# Patient Record
Sex: Female | Born: 1955
Health system: Southern US, Community
[De-identification: ages and names within clinical notes are randomized; demographics above are authoritative.]

## PROBLEM LIST (undated history)

## (undated) DIAGNOSIS — R7301 Impaired fasting glucose: Secondary | ICD-10-CM

## (undated) DIAGNOSIS — Z8041 Family history of malignant neoplasm of ovary: Secondary | ICD-10-CM

## (undated) DIAGNOSIS — N393 Stress incontinence (female) (male): Secondary | ICD-10-CM

## (undated) DIAGNOSIS — N809 Endometriosis, unspecified: Secondary | ICD-10-CM

## (undated) DIAGNOSIS — E559 Vitamin D deficiency, unspecified: Secondary | ICD-10-CM

## (undated) DIAGNOSIS — Z8042 Family history of malignant neoplasm of prostate: Secondary | ICD-10-CM

## (undated) DIAGNOSIS — E039 Hypothyroidism, unspecified: Secondary | ICD-10-CM

## (undated) HISTORY — DX: Family history of malignant neoplasm of prostate: Z80.42

## (undated) HISTORY — PX: TONSILLECTOMY AND ADENOIDECTOMY: SUR1326

## (undated) HISTORY — PX: LAPAROSCOPIC HYSTERECTOMY: SHX1926

## (undated) HISTORY — DX: Impaired fasting glucose: R73.01

## (undated) HISTORY — DX: Endometriosis, unspecified: N80.9

## (undated) HISTORY — DX: Vitamin D deficiency, unspecified: E55.9

## (undated) HISTORY — DX: Stress incontinence (female) (male): N39.3

## (undated) HISTORY — DX: Family history of malignant neoplasm of ovary: Z80.41

## (undated) HISTORY — DX: Hypothyroidism, unspecified: E03.9

---

## 1998-05-09 ENCOUNTER — Ambulatory Visit (HOSPITAL_COMMUNITY): Admission: RE | Admit: 1998-05-09 | Discharge: 1998-05-09 | Payer: Self-pay | Admitting: Family Medicine

## 2001-06-14 ENCOUNTER — Encounter: Payer: Self-pay | Admitting: Family Medicine

## 2001-06-14 ENCOUNTER — Encounter: Admission: RE | Admit: 2001-06-14 | Discharge: 2001-06-14 | Payer: Self-pay | Admitting: Family Medicine

## 2003-07-17 ENCOUNTER — Encounter: Admission: RE | Admit: 2003-07-17 | Discharge: 2003-07-17 | Payer: Self-pay | Admitting: Family Medicine

## 2003-07-17 ENCOUNTER — Encounter: Payer: Self-pay | Admitting: Family Medicine

## 2004-08-28 ENCOUNTER — Encounter: Admission: RE | Admit: 2004-08-28 | Discharge: 2004-08-28 | Payer: Self-pay | Admitting: Family Medicine

## 2004-09-30 ENCOUNTER — Encounter: Admission: RE | Admit: 2004-09-30 | Discharge: 2004-09-30 | Payer: Self-pay | Admitting: Family Medicine

## 2008-05-15 IMAGING — US US-BREAST([ID])
1 series · 14 of 14 positions shown · non-contrast
Comparison: NONE

CLINICAL DATA: c:  Ceejay, Paulus N   Follow-up mammogram. 

LEFT BREAST ULTRASOUND

[Series 1: us left breast · 0.09mm/px · 14 of 14 slices shown]
[im 1/14]
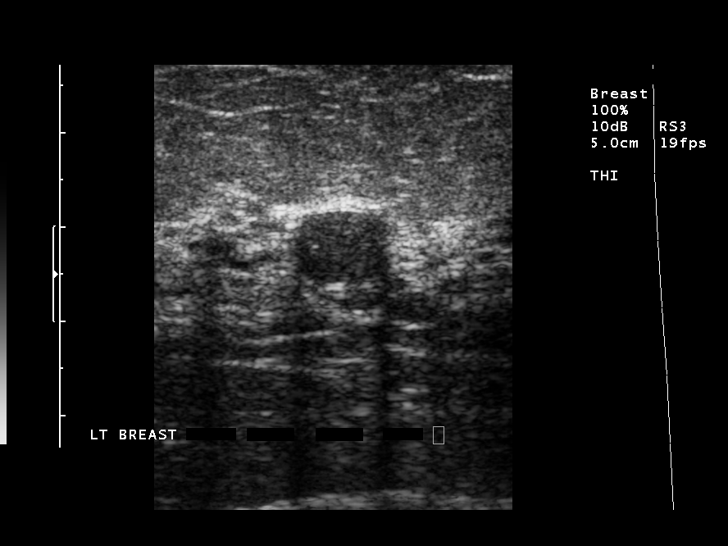
[im 2/14]
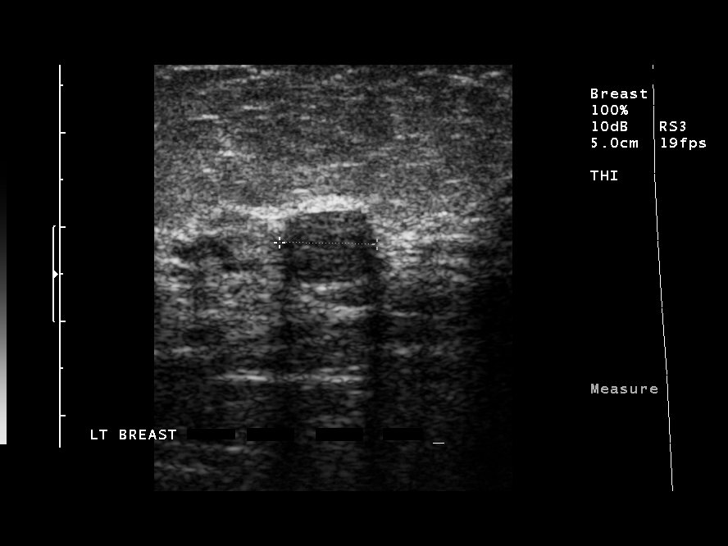
[im 3/14]
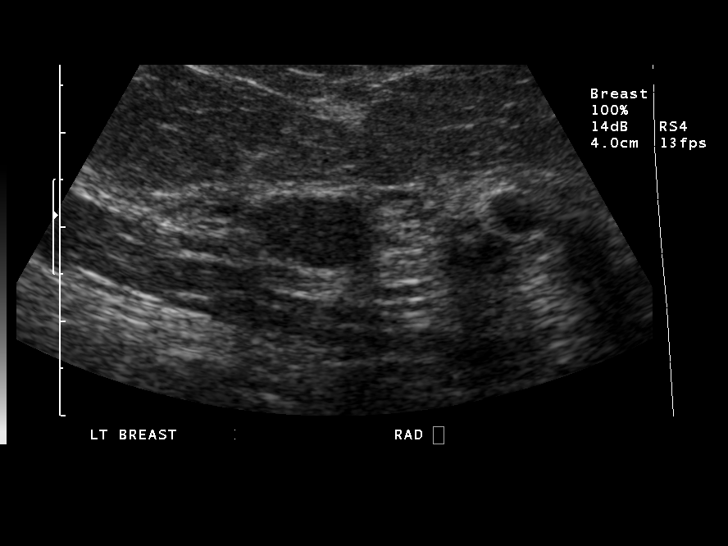
[im 4/14]
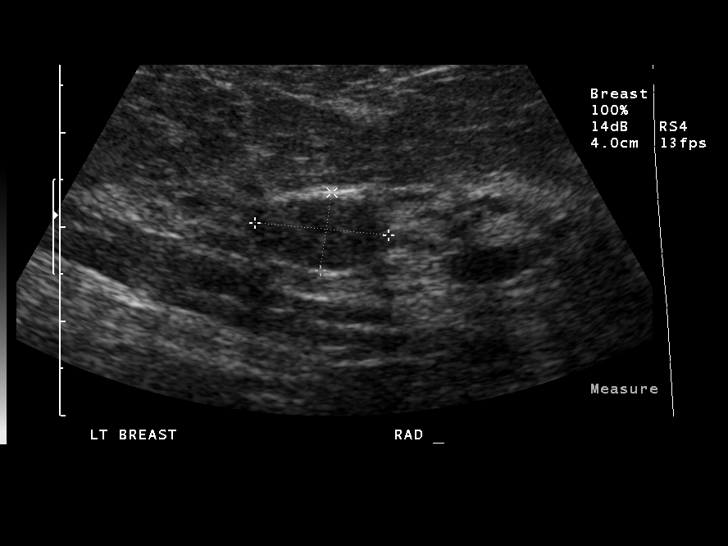
[im 5/14]
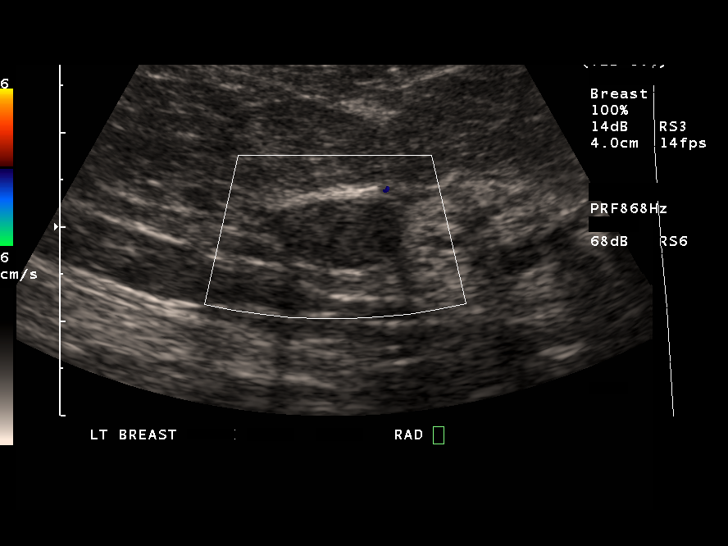
[im 6/14]
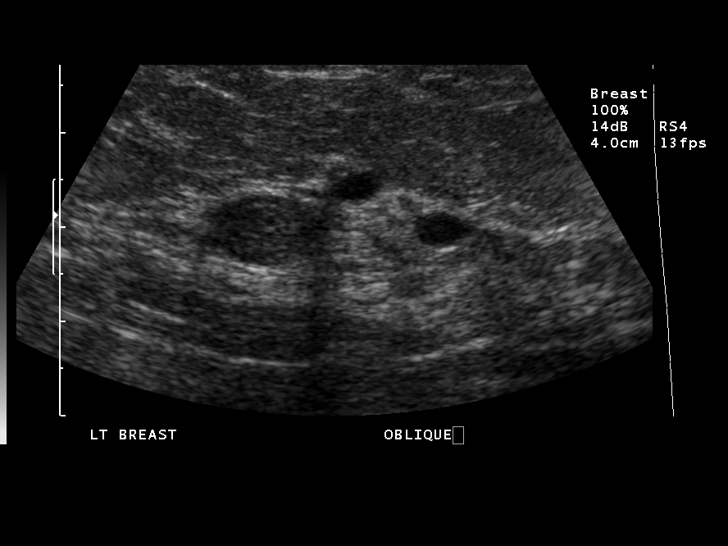
[im 7/14]
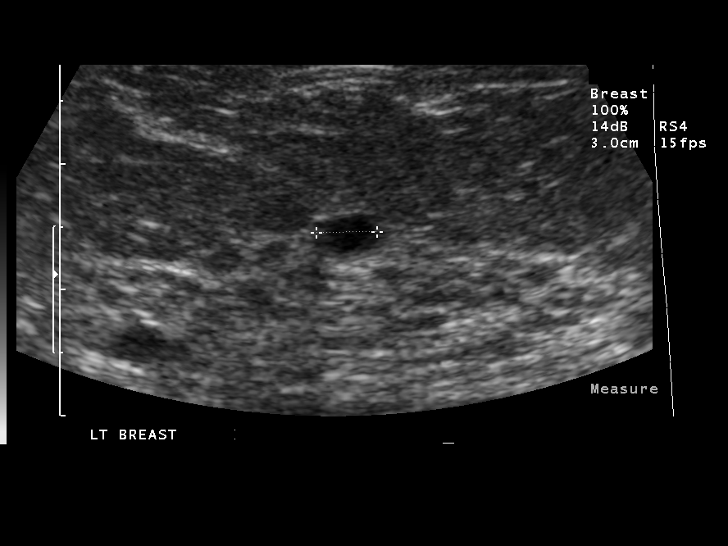
[im 8/14]
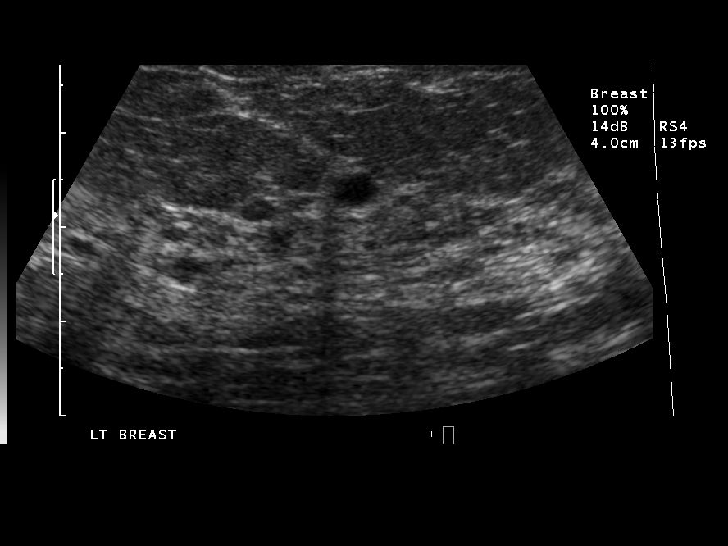
[im 9/14]
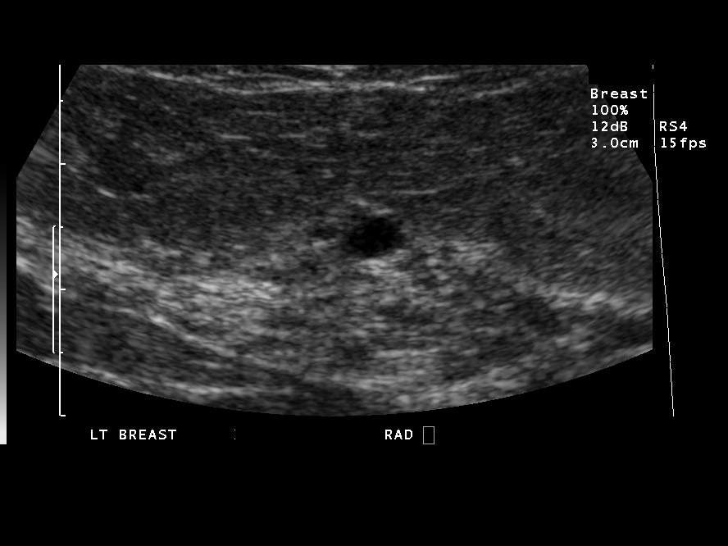
[im 10/14]
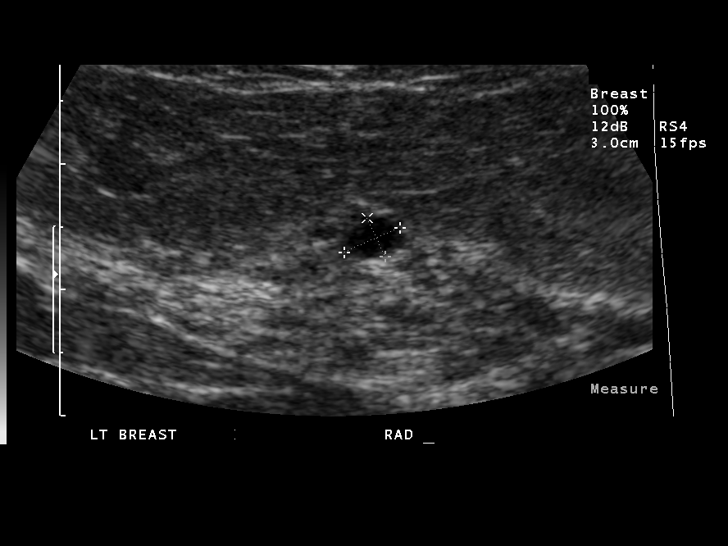
[im 11/14]
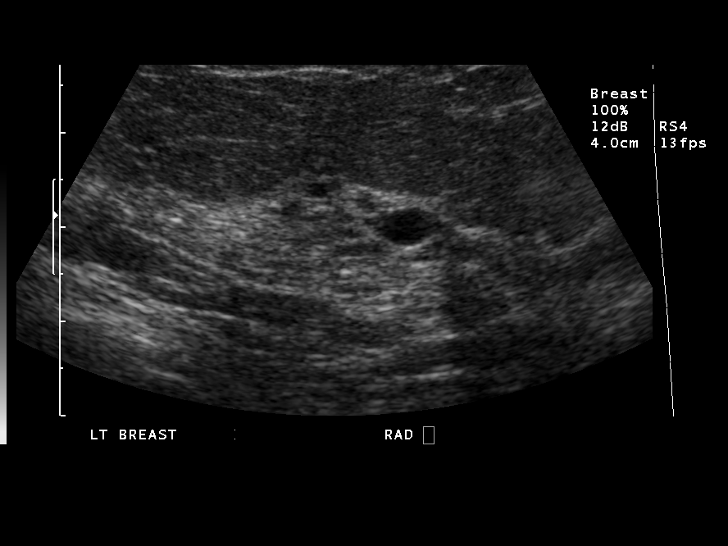
[im 12/14]
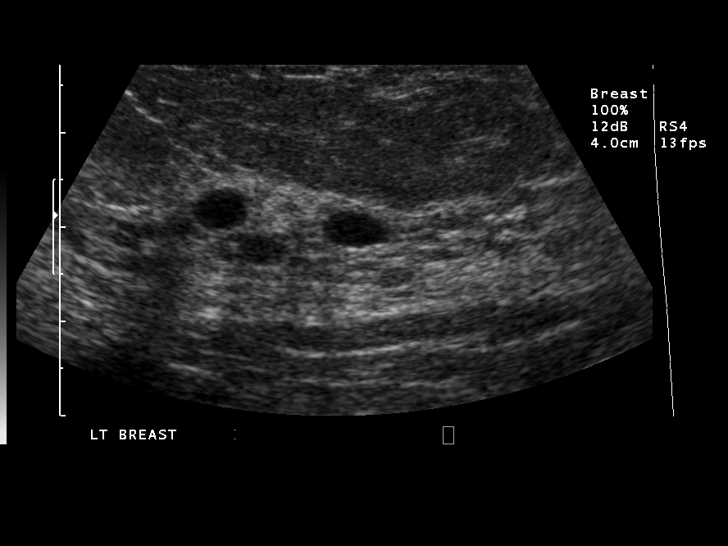
[im 13/14]
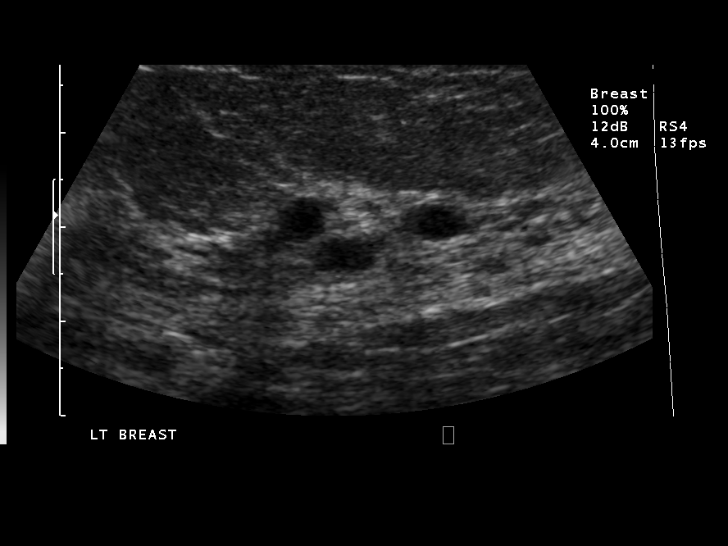
[im 14/14]
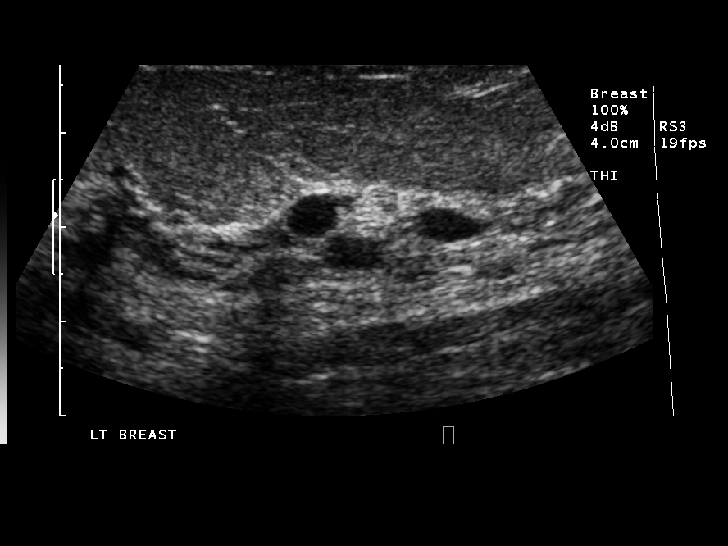

[14 of 14 positions shown; findings below may reference images not displayed]

FINDINGS: Ultrasound of the left breast demonstrates multiple 
cysts as well as areas of fibrocystic type change.  There is a 
debris filled cyst in the 12 o???clock position likely corresponding 
to the mammographic finding.
IMPRESSION: Ultrasound demonstrates no finding suspicious for 
malignancy.  Continue routine screening mammogram in one year. 
BI-RADS 2- Benign Tiger, Macho electronically reviewed 
on 10/03/2007 Dict Date: 09/30/2007  Tran Date:  10/03/2007 LJ  
GAUSE

## 2010-11-09 ENCOUNTER — Encounter: Payer: Self-pay | Admitting: Internal Medicine

## 2016-06-16 DIAGNOSIS — E039 Hypothyroidism, unspecified: Secondary | ICD-10-CM | POA: Diagnosis not present

## 2016-06-16 DIAGNOSIS — E038 Other specified hypothyroidism: Secondary | ICD-10-CM | POA: Diagnosis not present

## 2016-09-25 DIAGNOSIS — Z23 Encounter for immunization: Secondary | ICD-10-CM | POA: Diagnosis not present

## 2016-12-18 ENCOUNTER — Encounter: Payer: Self-pay | Admitting: Family Medicine

## 2016-12-18 ENCOUNTER — Ambulatory Visit (INDEPENDENT_AMBULATORY_CARE_PROVIDER_SITE_OTHER): Payer: BLUE CROSS/BLUE SHIELD | Admitting: Family Medicine

## 2016-12-18 VITALS — BP 140/92 | HR 88 | Ht 67.0 in | Wt 198.6 lb

## 2016-12-18 DIAGNOSIS — E039 Hypothyroidism, unspecified: Secondary | ICD-10-CM | POA: Diagnosis not present

## 2016-12-18 DIAGNOSIS — R5383 Other fatigue: Secondary | ICD-10-CM

## 2016-12-18 DIAGNOSIS — Z7689 Persons encountering health services in other specified circumstances: Secondary | ICD-10-CM | POA: Diagnosis not present

## 2016-12-18 DIAGNOSIS — Z8639 Personal history of other endocrine, nutritional and metabolic disease: Secondary | ICD-10-CM | POA: Diagnosis not present

## 2016-12-18 DIAGNOSIS — R030 Elevated blood-pressure reading, without diagnosis of hypertension: Secondary | ICD-10-CM | POA: Diagnosis not present

## 2016-12-18 LAB — CBC WITH DIFFERENTIAL/PLATELET
BASOS PCT: 1 %
Basophils Absolute: 82 cells/uL (ref 0–200)
EOS PCT: 0 %
Eosinophils Absolute: 0 cells/uL — ABNORMAL LOW (ref 15–500)
HCT: 44 % (ref 35.0–45.0)
Hemoglobin: 14.7 g/dL (ref 11.7–15.5)
Lymphocytes Relative: 28 %
Lymphs Abs: 2296 cells/uL (ref 850–3900)
MCH: 29.5 pg (ref 27.0–33.0)
MCHC: 33.4 g/dL (ref 32.0–36.0)
MCV: 88.4 fL (ref 80.0–100.0)
MONOS PCT: 6 %
MPV: 9.7 fL (ref 7.5–12.5)
Monocytes Absolute: 492 cells/uL (ref 200–950)
NEUTROS ABS: 5330 {cells}/uL (ref 1500–7800)
Neutrophils Relative %: 65 %
PLATELETS: 285 10*3/uL (ref 140–400)
RBC: 4.98 MIL/uL (ref 3.80–5.10)
RDW: 14.4 % (ref 11.0–15.0)
WBC: 8.2 10*3/uL (ref 4.0–10.5)

## 2016-12-18 LAB — COMPREHENSIVE METABOLIC PANEL
ALK PHOS: 70 U/L (ref 33–130)
ALT: 18 U/L (ref 6–29)
AST: 17 U/L (ref 10–35)
Albumin: 3.7 g/dL (ref 3.6–5.1)
BUN: 10 mg/dL (ref 7–25)
CO2: 29 mmol/L (ref 20–31)
CREATININE: 0.72 mg/dL (ref 0.50–0.99)
Calcium: 8.9 mg/dL (ref 8.6–10.4)
Chloride: 104 mmol/L (ref 98–110)
GLUCOSE: 97 mg/dL (ref 65–99)
Potassium: 4.1 mmol/L (ref 3.5–5.3)
SODIUM: 141 mmol/L (ref 135–146)
TOTAL PROTEIN: 7 g/dL (ref 6.1–8.1)
Total Bilirubin: 0.7 mg/dL (ref 0.2–1.2)

## 2016-12-18 LAB — TSH: TSH: 2.4 m[IU]/L

## 2016-12-18 NOTE — Patient Instructions (Signed)
Your blood pressure is elevated today 140/92. Check your blood pressure at home. If you are not seeing readings <130/80 then call me.   Hypertension Hypertension is another name for high blood pressure. High blood pressure forces your heart to work harder to pump blood. This can cause problems over time. There are two numbers in a blood pressure reading. There is a top number (systolic) over a bottom number (diastolic). It is best to have a blood pressure below 120/80. Healthy choices can help lower your blood pressure. You may need medicine to help lower your blood pressure if:  Your blood pressure cannot be lowered with healthy choices.  Your blood pressure is higher than 130/80. Follow these instructions at home: Eating and drinking   If directed, follow the DASH eating plan. This diet includes:  Filling half of your plate at each meal with fruits and vegetables.  Filling one quarter of your plate at each meal with whole grains. Whole grains include whole wheat pasta, brown rice, and whole grain bread.  Eating or drinking low-fat dairy products, such as skim milk or low-fat yogurt.  Filling one quarter of your plate at each meal with low-fat (lean) proteins. Low-fat proteins include fish, skinless chicken, eggs, beans, and tofu.  Avoiding fatty meat, cured and processed meat, or chicken with skin.  Avoiding premade or processed food.  Eat less than 1,500 mg of salt (sodium) a day.  Limit alcohol use to no more than 1 drink a day for nonpregnant women and 2 drinks a day for men. One drink equals 12 oz of beer, 5 oz of wine, or 1 oz of hard liquor. Lifestyle   Work with your doctor to stay at a healthy weight or to lose weight. Ask your doctor what the best weight is for you.  Get at least 30 minutes of exercise that causes your heart to beat faster (aerobic exercise) most days of the week. This may include walking, swimming, or biking.  Get at least 30 minutes of exercise that  strengthens your muscles (resistance exercise) at least 3 days a week. This may include lifting weights or pilates.  Do not use any products that contain nicotine or tobacco. This includes cigarettes and e-cigarettes. If you need help quitting, ask your doctor.  Check your blood pressure at home as told by your doctor.  Keep all follow-up visits as told by your doctor. This is important. Medicines   Take over-the-counter and prescription medicines only as told by your doctor. Follow directions carefully.  Do not skip doses of blood pressure medicine. The medicine does not work as well if you skip doses. Skipping doses also puts you at risk for problems.  Ask your doctor about side effects or reactions to medicines that you should watch for. Contact a doctor if:  You think you are having a reaction to the medicine you are taking.  You have headaches that keep coming back (recurring).  You feel dizzy.  You have swelling in your ankles.  You have trouble with your vision. Get help right away if:  You get a very bad headache.  You start to feel confused.  You feel weak or numb.  You feel faint.  You get very bad pain in your:  Chest.  Belly (abdomen).  You throw up (vomit) more than once.  You have trouble breathing. Summary  Hypertension is another name for high blood pressure.  Making healthy choices can help lower blood pressure. If your blood pressure  cannot be controlled with healthy choices, you may need to take medicine. This information is not intended to replace advice given to you by your health care provider. Make sure you discuss any questions you have with your health care provider. Document Released: 03/23/2008 Document Revised: 09/02/2016 Document Reviewed: 09/02/2016 Elsevier Interactive Patient Education  2017 [employer]

## 2016-12-18 NOTE — Progress Notes (Signed)
Subjective:    Patient ID: Krystal Padilla, female    DOB: Mar 26, 1956, 61 y.o.   MRN: [id number]  HPI Chief Complaint  Patient presents with  . new pt    new pt get established, 8 months ago, fatigue, and hair falling out, had low T4 in the past and hasnt had it checked in years, and vitamin d might be low as well.    She is new to the practice and here to establish care.  Previous medical care: Tomi Bamberger in [place]. States the office closed and she cannot get any of her medical records.  Last CPE: years ago  Complains of hair loss and fatigue for the past 8-9 months. Also reports "ridges on fingernails". Feeling cold a lot more often. States she feels like bilateral grip strength weaker in general. Palpitations for several months. Occasional constipation with a long history of constipation. Denies blood in stool.   Denies fever, chills, unexplained weight change, chest pain, abdominal pain, N/V/D.   States she is not doing well with lifestyle management. States she does not have time or energy to exercise due to work and taking care of her father.   Other providers: none  Past medical history: hypothyroidism, elevated BP in the past but has not taken medication for this. Hyperlipidemia- states she has taken medication for this in the past and it made her muscles ache and speech slurred. Has tried 2 cholesterol medications.  ?IBD, diverticula, statin intolerance   Family history: mother with ovarian cancer   Social history: Lives alone, divorced, works as an Print production planner. 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  Denies smoking, drinking alcohol, drug use  Diet: nothing particular Excerise: nothing   Health maintenance:  Mammogram: last one 4-5 years ago. States she does not plan on having another one.  Colonoscopy: age 27. States she will "never have another one because of the prep". States it almost "killed me".  Last Gynecological Exam: years ago Last Menstrual cycle:  hysterectomy    Reviewed allergies, medications, past medical, surgical, family, and social history.   Review of Systems Pertinent positives and negatives in the history of present illness.     Objective:   Physical Exam  Constitutional: She is oriented to person, place, and time. She appears well-developed and well-nourished. No distress.  HENT:  Nose: Nose normal.  Mouth/Throat: Uvula is midline, oropharynx is clear and moist and mucous membranes are normal.  No alopecia   Eyes: Conjunctivae and lids are normal. Pupils are equal, round, and reactive to light.  Neck: Full passive range of motion without pain. Neck supple. No thyromegaly present.  Cardiovascular: Normal rate, regular rhythm, normal heart sounds and normal pulses.   Pulmonary/Chest: Effort normal and breath sounds normal.  Lymphadenopathy:    She has no cervical adenopathy.  Neurological: She is alert and oriented to person, place, and time. She has normal strength and normal reflexes. No cranial nerve deficit or sensory deficit. Coordination and gait normal.  Skin: Skin is warm and dry. No rash noted. No pallor. Nails show no clubbing.  Normal hair pattern Nails smooth, no obvious ridges   Psychiatric: She has a normal mood and affect. Her speech is normal and behavior is normal. Judgment and thought content normal. Cognition and memory are normal.   BP (!) 140/92   Pulse 88   Ht 5\' 7"  (1.702 m)   Wt 198 lb 9.6 oz (90.1 kg)   BMI 31.11 kg/m  Assessment & Plan:  Fatigue, unspecified type - Plan: CBC with Differential/Platelet, Comprehensive metabolic panel, TSH, VITAMIN D 25 Hydroxy (Vit-D Deficiency, Fractures)  Hypothyroidism, unspecified type - Plan: TSH  History of vitamin D deficiency - Plan: VITAMIN D 25 Hydroxy (Vit-D Deficiency, Fractures)  Encounter to establish care  Elevated blood pressure reading  Discussed that she has multiple complaints today and there is no obvious explanation for  her symptoms. Reassured her that her exam is unremarkable but we will check labs and look for any underlying etiology.  She is overdue for health maintenance such as mammogram and colonoscopy but is adamant that she would not do anything if she found out she had cancer or anything serious so she is not going to have anymore of these screening tests.  Discussed that this is her right but part of my role as her PCP is to make her aware of preventive health care. Discussed shared medical decision making and what she wants out of our patient-provider relationship. States she would like to feel better and have more energy. Discussed that there are numerous etiologies for fatigue and we will check labs. She states she "only wants necessary labs done".  Recommend that she start checking her blood pressure and keep up with these readings. Follow up pending labs.

## 2016-12-19 LAB — VITAMIN D 25 HYDROXY (VIT D DEFICIENCY, FRACTURES): Vit D, 25-Hydroxy: 31 ng/mL (ref 30–100)

## 2016-12-21 ENCOUNTER — Encounter: Payer: Self-pay | Admitting: Internal Medicine

## 2017-01-21 ENCOUNTER — Telehealth: Payer: Self-pay | Admitting: Family Medicine

## 2017-01-21 MED ORDER — SYNTHROID 50 MCG PO TABS: 50.0000 ug | ORAL_TABLET | Freq: Every day | ORAL | 5 refills | Status: DC

## 2017-01-21 NOTE — Telephone Encounter (Signed)
Refilled med to pharmacy 

## 2017-01-21 NOTE — Telephone Encounter (Signed)
Ok to refill. Her TSH was normal last month.

## 2017-01-21 NOTE — Telephone Encounter (Signed)
Pt called for refills of synthroid. We have not filled for her before but is listed in meds. Pt would like the Generic sent into [facility] [address]. Pt can be reached at [phone].

## 2017-06-22 ENCOUNTER — Other Ambulatory Visit: Payer: Self-pay | Admitting: Family Medicine

## 2017-09-07 ENCOUNTER — Encounter: Payer: Self-pay | Admitting: Family Medicine

## 2017-09-07 ENCOUNTER — Ambulatory Visit: Payer: BLUE CROSS/BLUE SHIELD | Admitting: Family Medicine

## 2017-09-07 VITALS — BP 132/100 | HR 80 | Ht 66.75 in | Wt 196.4 lb

## 2017-09-07 DIAGNOSIS — I10 Essential (primary) hypertension: Secondary | ICD-10-CM | POA: Diagnosis not present

## 2017-09-07 DIAGNOSIS — Z5320 Procedure and treatment not carried out because of patient's decision for unspecified reasons: Secondary | ICD-10-CM

## 2017-09-07 DIAGNOSIS — Z79899 Other long term (current) drug therapy: Secondary | ICD-10-CM

## 2017-09-07 DIAGNOSIS — E559 Vitamin D deficiency, unspecified: Secondary | ICD-10-CM | POA: Diagnosis not present

## 2017-09-07 DIAGNOSIS — Z23 Encounter for immunization: Secondary | ICD-10-CM

## 2017-09-07 DIAGNOSIS — N393 Stress incontinence (female) (male): Secondary | ICD-10-CM

## 2017-09-07 DIAGNOSIS — Z1159 Encounter for screening for other viral diseases: Secondary | ICD-10-CM | POA: Diagnosis not present

## 2017-09-07 DIAGNOSIS — Z114 Encounter for screening for human immunodeficiency virus [HIV]: Secondary | ICD-10-CM

## 2017-09-07 DIAGNOSIS — Z0000 Encounter for general adult medical examination without abnormal findings: Secondary | ICD-10-CM

## 2017-09-07 DIAGNOSIS — R7301 Impaired fasting glucose: Secondary | ICD-10-CM | POA: Diagnosis not present

## 2017-09-07 DIAGNOSIS — E039 Hypothyroidism, unspecified: Secondary | ICD-10-CM

## 2017-09-07 LAB — POCT URINALYSIS DIP (PROADVANTAGE DEVICE)
BILIRUBIN UA: NEGATIVE
BILIRUBIN UA: NEGATIVE mg/dL
Blood, UA: NEGATIVE
GLUCOSE UA: NEGATIVE mg/dL
LEUKOCYTES UA: NEGATIVE
NITRITE UA: NEGATIVE
Protein Ur, POC: NEGATIVE mg/dL
Specific Gravity, Urine: 1.01
Urobilinogen, Ur: 3.5
pH, UA: 6 (ref 5.0–8.0)

## 2017-09-07 MED ORDER — LISINOPRIL 10 MG PO TABS: 10.0000 mg | ORAL_TABLET | Freq: Every day | ORAL | 1 refills | Status: DC

## 2017-09-07 NOTE — Patient Instructions (Addendum)
Your BP is elevated and it was in March 2018 as well.  You appear to have hypertension and it is untreated. Overtime this can cause heart damage, kidney damage, etc.   Start taking the Lisinopril as prescribed.  I recommend that you start checking her blood pressure outside of here.  Goal BP is <130/80  RETURN IN 4 WEEKS AND BRING IN YOUR BP CUFF AND READINGS.   Diet low in sodium and exercise can also help with your BP.   We will check your thyroid function and refill your thyroid medication as appropriate.  If you decide to go to physical therapy for pelvic floor weakness, you can let us know or you can check into this on your own.  You received a Tdap vaccine today in your arm at the injection site may be sore, red, warm and tender for 1-2 days.  If at any time you decide to get a mammogram or colonoscopy you can call and let me know.    Preventative Care for Adults - Female      MAINTAIN REGULAR HEALTH EXAMS:  A routine yearly physical is a good way to check in with your primary care provider about your health and preventive screening. It is also an opportunity to share updates about your health and any concerns you have, and receive a thorough all-over exam.   Most health insurance companies pay for at least some preventative services.  Check with your health plan for specific coverages.  WHAT PREVENTATIVE SERVICES DO WOMEN NEED?  Adult women should have their weight and blood pressure checked regularly.   Women age 61 and older should have their cholesterol levels checked regularly.  Women should be screened for cervical cancer with a Pap smear and pelvic exam beginning at either age 61, or 3 years after they become sexually activity.    Breast cancer screening generally begins at age 61 with a mammogram and breast exam by your primary care provider.    Beginning at age 61 and continuing to age 61, women should be screened for colorectal cancer.  Certain people may need  continued testing until age 61.  Updating vaccinations is part of preventative care.  Vaccinations help protect against diseases such as the flu.  Osteoporosis is a disease in which the bones lose minerals and strength as we age. Women ages 1365 and over should discuss this with their caregivers, as should women after menopause who have other risk factors.  Lab tests are generally done as part of preventative care to screen for anemia and blood disorders, to screen for problems with the kidneys and liver, to screen for bladder problems, to check blood sugar, and to check your cholesterol level.  Preventative services generally include counseling about diet, exercise, avoiding tobacco, drugs, excessive alcohol consumption, and sexually transmitted infections.    GENERAL RECOMMENDATIONS FOR GOOD HEALTH:  Healthy diet:  Eat a variety of foods, including fruit, vegetables, animal or vegetable protein, such as meat, fish, chicken, and eggs, or beans, lentils, tofu, and grains, such as rice.  Drink plenty of water daily.  Decrease saturated fat in the diet, avoid lots of red meat, processed foods, sweets, fast foods, and fried foods.  Exercise:  Aerobic exercise helps maintain good heart health. At least 30-40 minutes of moderate-intensity exercise is recommended. For example, a brisk walk that increases your heart rate and breathing. This should be done on most days of the week.   Find a type of exercise or a  variety of exercises that you enjoy so that it becomes a part of your daily life.  Examples are running, walking, swimming, water aerobics, and biking.  For motivation and support, explore group exercise such as aerobic class, spin class, Zumba, Yoga,or  martial arts, etc.    Set exercise goals for yourself, such as a certain weight goal, walk or run in a race such as a 5k walk/run.  Speak to your primary care provider about exercise goals.  Disease prevention:  If you smoke or chew  tobacco, find out from your caregiver how to quit. It can literally save your life, no matter how long you have been a tobacco user. If you do not use tobacco, never begin.   Maintain a healthy diet and normal weight. Increased weight leads to problems with blood pressure and diabetes.   The Body Mass Index or BMI is a way of measuring how much of your body is fat. Having a BMI above 27 increases the risk of heart disease, diabetes, hypertension, stroke and other problems related to obesity. Your caregiver can help determine your BMI and based on it develop an exercise and dietary program to help you achieve or maintain this important measurement at a healthful level.  High blood pressure causes heart and blood vessel problems.  Persistent high blood pressure should be treated with medicine if weight loss and exercise do not work.   Fat and cholesterol leaves deposits in your arteries that can block them. This causes heart disease and vessel disease elsewhere in your body.  If your cholesterol is found to be high, or if you have heart disease or certain other medical conditions, then you may need to have your cholesterol monitored frequently and be treated with medication.   Ask if you should have a cardiac stress test if your history suggests this. A stress test is a test done on a treadmill that looks for heart disease. This test can find disease prior to there being a problem.  Menopause can be associated with physical symptoms and risks. Hormone replacement therapy is available to decrease these. You should talk to your caregiver about whether starting or continuing to take hormones is right for you.   Osteoporosis is a disease in which the bones lose minerals and strength as we age. This can result in serious bone fractures. Risk of osteoporosis can be identified using a bone density scan. Women ages 61 and over should discuss this with their caregivers, as should women after menopause who have  other risk factors. Ask your caregiver whether you should be taking a calcium supplement and Vitamin D, to reduce the rate of osteoporosis.   Avoid drinking alcohol in excess (more than two drinks per day).  Avoid use of street drugs. Do not share needles with anyone. Ask for professional help if you need assistance or instructions on stopping the use of alcohol, cigarettes, and/or drugs.  Brush your teeth twice a day with fluoride toothpaste, and floss once a day. Good oral hygiene prevents tooth decay and gum disease. The problems can be painful, unattractive, and can cause other health problems. Visit your dentist for a routine oral and dental check up and preventive care every 6-12 months.   Look at your skin regularly.  Use a mirror to look at your back. Notify your caregivers of changes in moles, especially if there are changes in shapes, colors, a size larger than a pencil eraser, an irregular border, or development of new moles.  Safety:  Use seatbelts 100% of the time, whether driving or as a passenger.  Use safety devices such as hearing protection if you work in environments with loud noise or significant background noise.  Use safety glasses when doing any work that could send debris in to the eyes.  Use a helmet if you ride a bike or motorcycle.  Use appropriate safety gear for contact sports.  Talk to your caregiver about gun safety.  Use sunscreen with a SPF (or skin protection factor) of 15 or greater.  Lighter skinned people are at a greater risk of skin cancer. Don't forget to also wear sunglasses in order to protect your eyes from too much damaging sunlight. Damaging sunlight can accelerate cataract formation.   Practice safe sex. Use condoms. Condoms are used for birth control and to help reduce the spread of sexually transmitted infections (or STIs).  Some of the STIs are gonorrhea (the clap), chlamydia, syphilis, trichomonas, herpes, HPV (human papilloma virus) and HIV (human  immunodeficiency virus) which causes AIDS. The herpes, HIV and HPV are viral illnesses that have no cure. These can result in disability, cancer and death.   Keep carbon monoxide and smoke detectors in your home functioning at all times. Change the batteries every 6 months or use a model that plugs into the wall.   Vaccinations:  Stay up to date with your tetanus shots and other required immunizations. You should have a booster for tetanus every 10 years. Be sure to get your flu shot every year, since 5%-20% of the [place] population comes down with the flu. The flu vaccine changes each year, so being vaccinated once is not enough. Get your shot in the fall, before the flu season peaks.   Other vaccines to consider:  Human Papilloma Virus or HPV causes cancer of the cervix, and other infections that can be transmitted from person to person. There is a vaccine for HPV, and females should get immunized between the ages of 11 and 75. It requires a series of 3 shots.   Pneumococcal vaccine to protect against certain types of pneumonia.  This is normally recommended for adults age 45 or older.  However, adults younger than 61 years old with certain underlying conditions such as diabetes, heart or lung disease should also receive the vaccine.  Shingles vaccine to protect against Varicella Zoster if you are older than age 94, or younger than 61 years old with certain underlying illness.  Hepatitis A vaccine to protect against a form of infection of the liver by a virus acquired from food.  Hepatitis B vaccine to protect against a form of infection of the liver by a virus acquired from blood or body fluids, particularly if you work in health care.  If you plan to travel internationally, check with your local health department for specific vaccination recommendations.  Cancer Screening:  Breast cancer screening is essential to preventive care for women. All women age 24 and older should perform a breast  self-exam every month. At age 70 and older, women should have their caregiver complete a breast exam each year. Women at ages 32 and older should have a mammogram (x-ray film) of the breasts. Your caregiver can discuss how often you need mammograms.    Cervical cancer screening includes taking a Pap smear (sample of cells examined under a microscope) from the cervix (end of the uterus). It also includes testing for HPV (Human Papilloma Virus, which can cause cervical cancer). Screening and a pelvic exam  should begin at age 61, or 3 years after a woman becomes sexually active. Screening should occur every year, with a Pap smear but no HPV testing, up to age 61. After age 61, you should have a Pap smear every 3 years with HPV testing, if no HPV was found previously.   Most routine colon cancer screening begins at the age of 61. On a yearly basis, doctors may provide special easy to use take-home tests to check for hidden blood in the stool. Sigmoidoscopy or colonoscopy can detect the earliest forms of colon cancer and is life saving. These tests use a small camera at the end of a tube to directly examine the colon. Speak to your caregiver about this at age 61, when routine screening begins (and is repeated every 5 years unless early forms of pre-cancerous polyps or small growths are found).

## 2017-09-07 NOTE — Progress Notes (Signed)
Subjective:    Patient ID: Krystal Padilla, female    DOB: 02/04/1956, 61 y.o.   MRN: [id number]  HPI Chief Complaint  Patient presents with  . Annual Exam    increased her own Synthroid to 75mcg and feels great, doesn't want any testing as if cancer, not going to do anything.  Wants tdap.  Hair falling out   She is here for a complete physical exam. Previous medical care: Tomi BambergerSusan Fuller in [place].  Last CPE: years ago   BP was elevated in March 2018 at her first visit with me. I encouraged her at that point to start checking her BP at home but she has not done this.  She is aware that her BP is elevated today.   Other providers: none   States she has a history of vitamin D deficiency. Would like this checked. Has not been taking a supplement.   States she increased her dose of Synthroid on her own from 50 mcg to 75 mcg approximately 6 weeks ago.   States she is now happier and has more energy. States she had "brain fog" and this cleared up.   States she has intermittent stress incontinence. Does not do kegel exercises but she knows about these.  Family history: mother with ovarian cancer   Social history: Lives alone, divorced, unemployed.  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  Denis smoking, drinking alcohol, drug use  Diet: unhealthy - states she eats "cheap" Excerise: nothing  Immunizations: Tdap- due. She did get her flu shot.   Mammogram: last one 4-5 years ago. States she does not plan on having another one.  Colonoscopy: age 61. States she will "never have another one because of the prep". States it almost "killed me".  Last Gynecological Exam: years ago Last Menstrual cycle: hysterectomy  Last Dental Exam: 8 months ago  Last Eye Exam: history of Lasik bilaterally   Wears seatbelt always,  smoke detectors in home and functioning, does not text while driving and feels safe in home environment.   Reviewed allergies, medications, past medical, surgical, family, and social  history.   Review of Systems Review of Systems Constitutional: -fever, -chills, -sweats, -unexpected weight change,-fatigue ENT: -runny nose, -ear pain, -sore throat Cardiology:  -chest pain, -palpitations, -edema Respiratory: -cough, -shortness of breath, -wheezing Gastroenterology: -abdominal pain, -nausea, -vomiting, -diarrhea, -constipation  Hematology: -bleeding or bruising problems Musculoskeletal: -arthralgias, -myalgias, -joint swelling, -back pain Ophthalmology: -vision changes Urology: -dysuria, -difficulty urinating, -hematuria, -urinary frequency, -urgency Neurology: -headache, -weakness, -tingling, -numbness       Objective:   Physical Exam BP (!) 132/100   Pulse 80   Ht 5' 6.75" (1.695 m)   Wt 196 lb 6.4 oz (89.1 kg)   BMI 30.99 kg/m   General Appearance:    Alert, cooperative, no distress, appears stated age  Head:    Normocephalic, without obvious abnormality, atraumatic  Eyes:    PERRL, conjunctiva/corneas clear, EOM's intact, fundi    benign  Ears:    Normal TM's and external ear canals  Nose:   Nares normal, mucosa normal, no drainage or sinus   tenderness  Throat:   Lips, mucosa, and tongue normal; teeth and gums normal  Neck:   Supple, no lymphadenopathy;  thyroid:  no   enlargement/tenderness/nodules; no carotid   bruit or JVD  Back:    Spine nontender, no curvature, ROM normal, no CVA     tenderness  Lungs:     Clear to auscultation bilaterally without wheezes, rales or  ronchi; respirations unlabored  Chest Wall:    No tenderness or deformity   Heart:    Regular rate and rhythm, S1 and S2 normal, no murmur, rub   or gallop  Breast Exam:    Refuses   Abdomen:     Soft, non-tender, nondistended, normoactive bowel sounds,    no masses, no hepatosplenomegaly  Genitalia:    Refuses. Hysterectomy.      Extremities:   No clubbing, cyanosis or edema  Pulses:   2+ and symmetric all extremities  Skin:   Skin color, texture, turgor normal, no rashes.  Numerous nevi.   Lymph nodes:   Cervical, supraclavicular, and axillary nodes normal  Neurologic:   CNII-XII intact, normal strength, sensation and gait; reflexes 2+ and symmetric throughout          Psych:   Normal mood, affect, hygiene and grooming.    Urinalysis dipstick: negative       Assessment & Plan:  Routine general medical examination at a health care facility - Plan: CBC with Differential/Platelet, Comprehensive metabolic panel, POCT Urinalysis DIP (Proadvantage Device), Lipid panel  Essential hypertension - Plan: CBC with Differential/Platelet, Comprehensive metabolic panel, lisinopril (PRINIVIL,ZESTRIL) 10 MG tablet  Stress incontinence  Vitamin D deficiency - Plan: VITAMIN D 25 Hydroxy (Vit-D Deficiency, Fractures)  Hypothyroidism, unspecified type - Plan: TSH, T4, Free  Need for Tdap vaccination - Plan: Tdap vaccine greater than or equal to 7yo IM  Need for hepatitis C screening test - Plan: Hepatitis C antibody  Screening for HIV (human immunodeficiency virus) - Plan: HIV antibody  Medication management - Plan: TSH, T4, Free  Colonoscopy refused  Mammogram declined  Discussed importance of communication and not self medicating. She increased her dose of Synthroid on her own. Will need to recheck thyroid function today and adjust dose as appropriate.  Counseled on uncontrolled HTN and potential health risks. She agrees to start medication for this. Lisinopril sent to her pharmacy.  Recommend that she start checking her BP and keep a record of readings.  DASH discussed.  Follow up with her BP cuff and readings in 4 weeks or sooner if needed.  Offered to refer her to PT for pelvic floor weakness. She declines.  History of vitamin D deficiency- will check vitamin D level and start her on a supplement if needed.  Tdap given.  She refuses screening tests and states she would not have any treatment if cancer was found.

## 2017-09-08 ENCOUNTER — Other Ambulatory Visit: Payer: Self-pay | Admitting: Family Medicine

## 2017-09-08 ENCOUNTER — Encounter: Payer: Self-pay | Admitting: Family Medicine

## 2017-09-08 DIAGNOSIS — R7301 Impaired fasting glucose: Secondary | ICD-10-CM

## 2017-09-08 HISTORY — DX: Impaired fasting glucose: R73.01

## 2017-09-08 MED ORDER — LEVOTHYROXINE SODIUM 75 MCG PO TABS: 75.0000 ug | ORAL_TABLET | Freq: Every day | ORAL | 3 refills | Status: DC

## 2017-09-08 NOTE — Addendum Note (Signed)
Addended by: Laureen OchsURNER, Jermia Rigsby F on: 09/08/2017 02:41 PM   Modules accepted: Orders

## 2017-09-10 LAB — HEPATITIS C ANTIBODY
Hepatitis C Ab: NONREACTIVE
SIGNAL TO CUT-OFF: 0.01 (ref <1.00)

## 2017-09-10 LAB — LIPID PANEL
Cholesterol: 226 mg/dL — ABNORMAL HIGH (ref <200)
HDL: 43 mg/dL — ABNORMAL LOW (ref >50)
LDL CHOLESTEROL (CALC): 158 mg/dL — AB
NON-HDL CHOLESTEROL (CALC): 183 mg/dL — AB (ref <130)
TRIGLYCERIDES: 124 mg/dL (ref <150)
Total CHOL/HDL Ratio: 5.3 (calc) — ABNORMAL HIGH (ref <5.0)

## 2017-09-10 LAB — COMPREHENSIVE METABOLIC PANEL
AG RATIO: 1.3 (calc) (ref 1.0–2.5)
ALBUMIN MSPROF: 3.9 g/dL (ref 3.6–5.1)
ALT: 15 U/L (ref 6–29)
AST: 16 U/L (ref 10–35)
Alkaline phosphatase (APISO): 73 U/L (ref 33–130)
BILIRUBIN TOTAL: 0.6 mg/dL (ref 0.2–1.2)
BUN: 11 mg/dL (ref 7–25)
CALCIUM: 8.8 mg/dL (ref 8.6–10.4)
CO2: 26 mmol/L (ref 20–32)
Chloride: 104 mmol/L (ref 98–110)
Creat: 0.69 mg/dL (ref 0.50–0.99)
GLUCOSE: 101 mg/dL — AB (ref 65–99)
Globulin: 3.1 g/dL (calc) (ref 1.9–3.7)
POTASSIUM: 3.8 mmol/L (ref 3.5–5.3)
SODIUM: 138 mmol/L (ref 135–146)
TOTAL PROTEIN: 7 g/dL (ref 6.1–8.1)

## 2017-09-10 LAB — CBC WITH DIFFERENTIAL/PLATELET
BASOS ABS: 77 {cells}/uL (ref 0–200)
BASOS PCT: 0.9 %
Eosinophils Absolute: 34 cells/uL (ref 15–500)
Eosinophils Relative: 0.4 %
HEMATOCRIT: 42.8 % (ref 35.0–45.0)
HEMOGLOBIN: 14.8 g/dL (ref 11.7–15.5)
LYMPHS ABS: 2355 {cells}/uL (ref 850–3900)
MCH: 29.7 pg (ref 27.0–33.0)
MCHC: 34.6 g/dL (ref 32.0–36.0)
MCV: 85.8 fL (ref 80.0–100.0)
MONOS PCT: 5.6 %
MPV: 10.6 fL (ref 7.5–12.5)
NEUTROS ABS: 5559 {cells}/uL (ref 1500–7800)
Neutrophils Relative %: 65.4 %
Platelets: 281 10*3/uL (ref 140–400)
RBC: 4.99 10*6/uL (ref 3.80–5.10)
RDW: 13.5 % (ref 11.0–15.0)
Total Lymphocyte: 27.7 %
WBC mixed population: 476 cells/uL (ref 200–950)
WBC: 8.5 10*3/uL (ref 3.8–10.8)

## 2017-09-10 LAB — VITAMIN D 25 HYDROXY (VIT D DEFICIENCY, FRACTURES): Vit D, 25-Hydroxy: 27 ng/mL — ABNORMAL LOW (ref 30–100)

## 2017-09-10 LAB — TEST AUTHORIZATION

## 2017-09-10 LAB — TSH: TSH: 1.64 m[IU]/L (ref 0.40–4.50)

## 2017-09-10 LAB — T4, FREE: Free T4: 1.2 ng/dL (ref 0.8–1.8)

## 2017-09-10 LAB — HEMOGLOBIN A1C W/OUT EAG: HEMOGLOBIN A1C: 5.7 %{Hb} — AB (ref <5.7)

## 2017-09-10 LAB — HIV ANTIBODY (ROUTINE TESTING W REFLEX): HIV 1&2 Ab, 4th Generation: NONREACTIVE

## 2017-09-16 ENCOUNTER — Telehealth: Payer: Self-pay | Admitting: Family Medicine

## 2017-09-16 MED ORDER — LEVOTHYROXINE SODIUM 75 MCG PO TABS: 75.0000 ug | ORAL_TABLET | Freq: Every day | ORAL | 0 refills | Status: DC

## 2017-09-16 NOTE — Telephone Encounter (Signed)
rx changed to 90 days

## 2017-09-16 NOTE — Telephone Encounter (Signed)
Rcvd request from [facility] requesting that Levothyroxine script be changed to a 90 day script

## 2017-10-06 ENCOUNTER — Ambulatory Visit: Payer: BLUE CROSS/BLUE SHIELD | Admitting: Family Medicine

## 2018-03-07 ENCOUNTER — Other Ambulatory Visit: Payer: Self-pay | Admitting: Family Medicine

## 2018-03-07 NOTE — Telephone Encounter (Signed)
Left message for pt to call back and schedule an appt then we can refill med for 30 days

## 2018-03-16 ENCOUNTER — Encounter: Payer: Self-pay | Admitting: Family Medicine

## 2018-03-16 ENCOUNTER — Ambulatory Visit: Payer: BLUE CROSS/BLUE SHIELD | Admitting: Family Medicine

## 2018-03-16 VITALS — BP 162/84 | HR 76 | Resp 20 | Ht 66.75 in | Wt 188.6 lb

## 2018-03-16 DIAGNOSIS — E559 Vitamin D deficiency, unspecified: Secondary | ICD-10-CM | POA: Diagnosis not present

## 2018-03-16 DIAGNOSIS — Z5329 Procedure and treatment not carried out because of patient's decision for other reasons: Secondary | ICD-10-CM | POA: Diagnosis not present

## 2018-03-16 DIAGNOSIS — Z5320 Procedure and treatment not carried out because of patient's decision for unspecified reasons: Secondary | ICD-10-CM

## 2018-03-16 DIAGNOSIS — I10 Essential (primary) hypertension: Secondary | ICD-10-CM | POA: Diagnosis not present

## 2018-03-16 DIAGNOSIS — Z79899 Other long term (current) drug therapy: Secondary | ICD-10-CM

## 2018-03-16 DIAGNOSIS — E039 Hypothyroidism, unspecified: Secondary | ICD-10-CM

## 2018-03-16 DIAGNOSIS — Z789 Other specified health status: Secondary | ICD-10-CM | POA: Diagnosis not present

## 2018-03-16 DIAGNOSIS — E782 Mixed hyperlipidemia: Secondary | ICD-10-CM

## 2018-03-16 DIAGNOSIS — R7301 Impaired fasting glucose: Secondary | ICD-10-CM

## 2018-03-16 NOTE — Progress Notes (Signed)
   Subjective:    Patient ID: Krystal Padilla, female    DOB: 1956/01/14, 62 y.o.   MRN: [id number]  HPI Chief Complaint  Patient presents with  . Follow-up    follow on thyroid. No concerns. She did get rx for bp medication but did not start it because her readings at home were not that high.    States she is here to follow-up on her thyroid function.  Has a history of adjusting her dose of Synthroid according to how she feels.  Most recently she has been taking 75 mcg daily. States she feels good on this dose. No longer having a "mental fog".  Reports taking this on an empty stomach first thing in the morning   History of uncontrolled hypertension and has refused medication in the past. We discussed her HTN and she has lisinopril at home. Refuses to start on this.  Checking her blood pressure at home and her readings have been in the 150s/80-90s.   Discussed that her Hgb A1c was elevated in November.   Hyperlipidemia- states she has taken Crestor and had leg cramps and muscle weakness in the past. States she tried two different statins and could not tolerate either.  She is not fasting today.   History of vitamin D deficiency and is not taking a supplement.   States she has a new job at [employer] and this is strenuous. Taking advil 8 per day for muscle aches and pains. Right elbow is sore but improving. History of hip bursitis and has had injections in the past. This has been hurting her more.   Denies fever, chills, dizziness, chest pain, palpitations, shortness of breath, abdominal pain, N/V/D, LE edema.     Reviewed allergies, medications, past medical, surgical, family, and social history.   Review of Systems Pertinent positives and negatives in the history of present illness.     Objective:   Physical Exam BP (!) 162/84   Pulse 76   Resp 20   Ht 5' 6.75" (1.695 m)   Wt 188 lb 9.6 oz (85.5 kg)   BMI 29.76 kg/m   Alert and oriented and in no acute  distress.  Not otherwise examined.      Assessment & Plan:  Hypothyroidism, unspecified type - Plan: TSH  Impaired fasting glucose  Uncontrolled hypertension  Mixed hyperlipidemia  Vitamin D deficiency - Plan: VITAMIN D 25 Hydroxy (Vit-D Deficiency, Fractures)  Refusal of hypertension treatment  Statin intolerance  Medication management - Plan: TSH, VITAMIN D 25 Hydroxy (Vit-D Deficiency, Fractures)  Reports good medication compliance with Synthroid and needs recheck of thyroid function. She is aware that she is prediabetic. Discussed in depth the risks  and potential long-term health consequences of uncontrolled hypertension and hyperlipidemia.  She reports being statin intolerant having tried 2 different statins in the past. She refuses medication for hypertension.  States she is aware of the potential risks involved. She would like her vitamin D level checked today.  She is not currently taking a vitamin D supplement. Follow-up pending labs.

## 2018-03-17 LAB — VITAMIN D 25 HYDROXY (VIT D DEFICIENCY, FRACTURES): VIT D 25 HYDROXY: 26.1 ng/mL — AB (ref 30.0–100.0)

## 2018-03-17 LAB — TSH: TSH: 3.06 u[IU]/mL (ref 0.450–4.500)

## 2018-03-28 ENCOUNTER — Other Ambulatory Visit: Payer: Self-pay | Admitting: Family Medicine

## 2018-09-21 ENCOUNTER — Other Ambulatory Visit: Payer: Self-pay | Admitting: Family Medicine

## 2018-10-07 ENCOUNTER — Ambulatory Visit: Payer: BLUE CROSS/BLUE SHIELD | Admitting: Family Medicine

## 2018-10-07 ENCOUNTER — Encounter: Payer: Self-pay | Admitting: Family Medicine

## 2018-10-07 VITALS — BP 140/80 | HR 72 | Wt 191.2 lb

## 2018-10-07 DIAGNOSIS — E785 Hyperlipidemia, unspecified: Secondary | ICD-10-CM | POA: Diagnosis not present

## 2018-10-07 DIAGNOSIS — I10 Essential (primary) hypertension: Secondary | ICD-10-CM | POA: Diagnosis not present

## 2018-10-07 DIAGNOSIS — Z79899 Other long term (current) drug therapy: Secondary | ICD-10-CM

## 2018-10-07 DIAGNOSIS — R7301 Impaired fasting glucose: Secondary | ICD-10-CM | POA: Diagnosis not present

## 2018-10-07 DIAGNOSIS — E559 Vitamin D deficiency, unspecified: Secondary | ICD-10-CM | POA: Diagnosis not present

## 2018-10-07 DIAGNOSIS — L659 Nonscarring hair loss, unspecified: Secondary | ICD-10-CM | POA: Diagnosis not present

## 2018-10-07 DIAGNOSIS — E039 Hypothyroidism, unspecified: Secondary | ICD-10-CM | POA: Diagnosis not present

## 2018-10-07 NOTE — Progress Notes (Signed)
   Subjective:    Patient ID: Krystal Padilla, female    DOB: June 29, 1956, 62 y.o.   MRN: [id number]  HPI Chief Complaint  Patient presents with  . med check    med check, no other concerns, hair falling out more in clumps   She is here for a medication management visit.   Taking levothyroxine daily on an empty stomach.   Reports increased hair loss over the past 6 weeks. She also has noticed a weight increase.  Snores. Daytime sleepiness. Does not want to pursue sleep study right now.   HTN- managed with diet. Has not taken medications in years and declines.   Hgb A1c 5.7 % last year.  History of vitamin D deficiency. Is not currently taking vitamin D.   Denies fever, chills, dizziness, headache, chest pain, palpitations, DOE, orthopnea, abdominal pain, N/V/D, urinary symptoms.   Reviewed allergies, medications, past medical, surgical, family, and social history.   Review of Systems Pertinent positives and negatives in the history of present illness.     Objective:   Physical Exam BP 140/80   Pulse 72   Wt 191 lb 3.2 oz (86.7 kg)   BMI 30.17 kg/m   Alert and oriented and in no acute distress. Not otherwise examined.       Assessment & Plan:  Hypothyroidism, unspecified type - Plan: TSH, T4, free, T3  Impaired fasting glucose - Plan: Comprehensive metabolic panel, Hemoglobin A1c  Vitamin D deficiency - Plan: VITAMIN D 25 Hydroxy (Vit-D Deficiency, Fractures)  Medication management - Plan: TSH, T4, free, T3, VITAMIN D 25 Hydroxy (Vit-D Deficiency, Fractures)  Hair loss - Plan: CBC with Differential/Platelet, TSH, T4, free, T3  Hypertension, unspecified type - Plan: CBC with Differential/Platelet, Comprehensive metabolic panel  Hyperlipidemia, unspecified hyperlipidemia type - Plan: Lipid panel  She is here today for medication management check.  Reports good daily compliance with levothyroxine and no side effects.  She does report increased hair loss.  Recheck  thyroid panel and follow-up. History of elevated hemoglobin A1c at 5.7%.  Recheck A1c today. History of hyperlipidemia.  Check lipid panel.  She is not fasting. Hypertension-declines medication.  Currently controlled with diet Counseling on healthy diet and exercise to help control blood pressure and other chronic health conditions. Vitamin D deficiency is not currently taking a supplement.  Check vitamin D level Follow-up pending labs

## 2018-10-08 LAB — CBC WITH DIFFERENTIAL/PLATELET
BASOS: 1 %
Basophils Absolute: 0.1 10*3/uL (ref 0.0–0.2)
EOS (ABSOLUTE): 0.1 10*3/uL (ref 0.0–0.4)
EOS: 1 %
HEMATOCRIT: 41.5 % (ref 34.0–46.6)
Hemoglobin: 14.3 g/dL (ref 11.1–15.9)
IMMATURE GRANS (ABS): 0 10*3/uL (ref 0.0–0.1)
Immature Granulocytes: 0 %
Lymphocytes Absolute: 2.4 10*3/uL (ref 0.7–3.1)
Lymphs: 31 %
MCH: 29.5 pg (ref 26.6–33.0)
MCHC: 34.5 g/dL (ref 31.5–35.7)
MCV: 86 fL (ref 79–97)
MONOS ABS: 0.5 10*3/uL (ref 0.1–0.9)
Monocytes: 6 %
NEUTROS ABS: 4.7 10*3/uL (ref 1.4–7.0)
Neutrophils: 61 %
PLATELETS: 292 10*3/uL (ref 150–450)
RBC: 4.84 x10E6/uL (ref 3.77–5.28)
RDW: 13.3 % (ref 12.3–15.4)
WBC: 7.9 10*3/uL (ref 3.4–10.8)

## 2018-10-08 LAB — COMPREHENSIVE METABOLIC PANEL
ALT: 15 IU/L (ref 0–32)
AST: 18 IU/L (ref 0–40)
Albumin/Globulin Ratio: 1.5 (ref 1.2–2.2)
Albumin: 4 g/dL (ref 3.6–4.8)
Alkaline Phosphatase: 87 IU/L (ref 39–117)
BILIRUBIN TOTAL: 0.3 mg/dL (ref 0.0–1.2)
BUN/Creatinine Ratio: 19 (ref 12–28)
BUN: 13 mg/dL (ref 8–27)
CO2: 23 mmol/L (ref 20–29)
CREATININE: 0.7 mg/dL (ref 0.57–1.00)
Calcium: 9.4 mg/dL (ref 8.7–10.3)
Chloride: 100 mmol/L (ref 96–106)
GFR calc Af Amer: 107 mL/min/{1.73_m2} (ref >59)
GFR calc non Af Amer: 93 mL/min/{1.73_m2} (ref >59)
GLOBULIN, TOTAL: 2.6 g/dL (ref 1.5–4.5)
Glucose: 103 mg/dL — ABNORMAL HIGH (ref 65–99)
Potassium: 4.2 mmol/L (ref 3.5–5.2)
Sodium: 139 mmol/L (ref 134–144)
TOTAL PROTEIN: 6.6 g/dL (ref 6.0–8.5)

## 2018-10-08 LAB — LIPID PANEL
Chol/HDL Ratio: 5.5 ratio — ABNORMAL HIGH (ref 0.0–4.4)
Cholesterol, Total: 242 mg/dL — ABNORMAL HIGH (ref 100–199)
HDL: 44 mg/dL (ref >39)
LDL Calculated: 166 mg/dL — ABNORMAL HIGH (ref 0–99)
Triglycerides: 158 mg/dL — ABNORMAL HIGH (ref 0–149)
VLDL Cholesterol Cal: 32 mg/dL (ref 5–40)

## 2018-10-08 LAB — T4, FREE: Free T4: 1.24 ng/dL (ref 0.82–1.77)

## 2018-10-08 LAB — VITAMIN D 25 HYDROXY (VIT D DEFICIENCY, FRACTURES): Vit D, 25-Hydroxy: 31.2 ng/mL (ref 30.0–100.0)

## 2018-10-08 LAB — HEMOGLOBIN A1C
Est. average glucose Bld gHb Est-mCnc: 120 mg/dL
Hgb A1c MFr Bld: 5.8 % — ABNORMAL HIGH (ref 4.8–5.6)

## 2018-10-08 LAB — TSH: TSH: 2.54 u[IU]/mL (ref 0.450–4.500)

## 2018-10-08 LAB — T3: T3, Total: 98 ng/dL (ref 71–180)

## 2019-03-09 ENCOUNTER — Telehealth: Payer: Self-pay | Admitting: Internal Medicine

## 2019-03-09 NOTE — Telephone Encounter (Signed)
Left message for pt to call back to schedule an appt 

## 2019-03-29 ENCOUNTER — Ambulatory Visit (INDEPENDENT_AMBULATORY_CARE_PROVIDER_SITE_OTHER): Payer: BC Managed Care – PPO | Admitting: Family Medicine

## 2019-03-29 ENCOUNTER — Encounter: Payer: Self-pay | Admitting: Family Medicine

## 2019-03-29 ENCOUNTER — Other Ambulatory Visit: Payer: Self-pay

## 2019-03-29 VITALS — Wt 193.0 lb

## 2019-03-29 DIAGNOSIS — Z5320 Procedure and treatment not carried out because of patient's decision for unspecified reasons: Secondary | ICD-10-CM

## 2019-03-29 DIAGNOSIS — E785 Hyperlipidemia, unspecified: Secondary | ICD-10-CM

## 2019-03-29 DIAGNOSIS — I10 Essential (primary) hypertension: Secondary | ICD-10-CM

## 2019-03-29 DIAGNOSIS — Z789 Other specified health status: Secondary | ICD-10-CM

## 2019-03-29 DIAGNOSIS — E039 Hypothyroidism, unspecified: Secondary | ICD-10-CM | POA: Diagnosis not present

## 2019-03-29 DIAGNOSIS — Z5329 Procedure and treatment not carried out because of patient's decision for other reasons: Secondary | ICD-10-CM

## 2019-03-29 HISTORY — DX: Procedure and treatment not carried out because of patient's decision for other reasons: Z53.29

## 2019-03-29 HISTORY — DX: Procedure and treatment not carried out because of patient's decision for unspecified reasons: Z53.20

## 2019-03-29 HISTORY — DX: Other specified health status: Z78.9

## 2019-03-29 MED ORDER — LEVOTHYROXINE SODIUM 75 MCG PO TABS: 75.0000 ug | ORAL_TABLET | Freq: Every day | ORAL | 0 refills | Status: DC

## 2019-03-29 NOTE — Progress Notes (Signed)
   Subjective:  Documentation for virtual telephone encounter. She refused to do a video call.   The patient was located in her car. 2 identifiers used.  The provider was located in the office.  The patient did consent to this visit and is aware of possible charges through their insurance for this visit.  The other persons participating in this telemedicine service were none.    Patient ID: Krystal Padilla, female    DOB: 23-Nov-1955, 63 y.o.   MRN: [id number]  HPI Chief Complaint  Patient presents with  . med check    med check, no other concerns   She is due for a medication management.   Hypothyroidism- has been stable on current dose of levothyroxine.  Complains of intermittent hair loss but this has improved over the past 3 months. No longer an issue. She thinks this was related to levothyroxine so she asked her pharmacy for a batch from a different manufacturer.   HTN- states she does not want medication for her BP. States she knows it is elevated. 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  States she is aware of possible health issues related to high blood pressure.  States she had bone loss of her mouth.  Reports having a normal bone density at age 33. I do not have a record of this.   She refuses mammogram, bone density.   Refused statin. States she had muscle aches and weakness from Crestor. Does not want to try another statin.   Denies fever, chills, dizziness, chest pain, palpitations, shortness of breath, abdominal pain, N/V/D, urinary symptoms, LE edema.     Review of Systems Pertinent positives and negatives in the history of present illness.     Objective:   Physical Exam Wt 193 lb (87.5 kg)   BMI 30.45 kg/m  Alert and oriented in no acute distress.  Not otherwise examined due to this being a virtual visit.      Assessment & Plan:  Hypothyroidism, unspecified type - Plan: levothyroxine (SYNTHROID) 75 MCG tablet  Statin intolerance   Hyperlipidemia, unspecified hyperlipidemia type  Uncontrolled hypertension  Refusal of hypertension treatment Discussed limitations of a virtual visit. She appears to be in her usual state of health. Reviewed recent labs and thyroid function is stable.  I will refill her levothyroxine for 3 months. She is aware that her blood pressure is uncontrolled.  Once again I discussed potential long-term health consequences associated with uncontrolled hypertension such as heart failure and renal disease.  States she is aware and she does not want to take medication for this. She is also aware that her LDL is elevated which is also a risk factor for heart disease.  Declines statin She declines having a mammogram or any other health preventative testing at this time. Recommend follow-up in the office in 3 months to ensure thyroid function is still in goal range.  Time spent on call was 22 minutes and in review of previous records 2 minutes total.  This virtual service is not related to other E/M service within previous 7 days.

## 2019-06-20 ENCOUNTER — Other Ambulatory Visit: Payer: Self-pay | Admitting: Family Medicine

## 2019-06-20 DIAGNOSIS — E039 Hypothyroidism, unspecified: Secondary | ICD-10-CM

## 2019-07-03 ENCOUNTER — Encounter: Payer: Self-pay | Admitting: Family Medicine

## 2019-07-03 DIAGNOSIS — E039 Hypothyroidism, unspecified: Secondary | ICD-10-CM

## 2019-07-04 MED ORDER — LEVOTHYROXINE SODIUM 75 MCG PO TABS: 75.0000 ug | ORAL_TABLET | Freq: Every day | ORAL | 0 refills | Status: AC

## 2021-04-30 ENCOUNTER — Ambulatory Visit (HOSPITAL_BASED_OUTPATIENT_CLINIC_OR_DEPARTMENT_OTHER): Payer: Medicare Other | Admitting: Genetic Counselor

## 2021-04-30 ENCOUNTER — Inpatient Hospital Stay: Payer: Self-pay | Attending: Genetic Counselor

## 2021-04-30 DIAGNOSIS — Z8041 Family history of malignant neoplasm of ovary: Secondary | ICD-10-CM

## 2021-04-30 DIAGNOSIS — Z800 Family history of malignant neoplasm of digestive organs: Secondary | ICD-10-CM | POA: Diagnosis not present

## 2021-04-30 DIAGNOSIS — Z8042 Family history of malignant neoplasm of prostate: Secondary | ICD-10-CM | POA: Diagnosis not present

## 2021-05-01 ENCOUNTER — Encounter: Payer: Self-pay | Admitting: Genetic Counselor

## 2021-05-01 DIAGNOSIS — Z8042 Family history of malignant neoplasm of prostate: Secondary | ICD-10-CM | POA: Insufficient documentation

## 2021-05-01 DIAGNOSIS — Z8041 Family history of malignant neoplasm of ovary: Secondary | ICD-10-CM | POA: Insufficient documentation

## 2021-05-01 NOTE — Progress Notes (Signed)
REFERRING PROVIDER: Girtha Rm, NP-C [place],  [place] [address]  PRIMARY PROVIDER:  Girtha Rm, NP-C  PRIMARY REASON FOR VISIT:  1. Family history of ovarian cancer   2. Family history of prostate cancer      HISTORY OF PRESENT ILLNESS:   Ms. Sauerwein, a 65 y.o. female, was seen for a [facility] cancer genetics consultation at the request of Dr. Raenette Rover due to a family history of cancer.  Ms. Ricchio presents to clinic today to discuss the possibility of a hereditary predisposition to cancer, genetic testing, and to further clarify her future cancer risks, as well as potential cancer risks for family members.   Ms. Krauss is a 65 y.o. female with no personal history of cancer.    CANCER HISTORY:  Oncology History   No history exists.     RISK FACTORS:  Menarche was at age 49.  First live birth at age 35.  OCP use for approximately  10-15  years.  Ovaries intact: one ovary intact.  Hysterectomy: yes.  Menopausal status: postmenopausal.  HRT use: 0 years. Colonoscopy: yes; normal. Mammogram within the last year: no. Number of breast biopsies: 0. Up to date with pelvic exams: no. Any excessive radiation exposure in the past: no  Past Medical History:  Diagnosis Date   Endometriosis    hysterectomy    Family history of ovarian cancer    Family history of prostate cancer    Hypothyroidism    Impaired fasting glucose 09/08/2017   Refusal of hypertension treatment 03/29/2019   Statin intolerance 03/29/2019   Muscle weakness   Stress incontinence    Vitamin D deficiency     Past Surgical History:  Procedure Laterality Date   LAPAROSCOPIC HYSTERECTOMY     TONSILLECTOMY AND ADENOIDECTOMY      Social History   Socioeconomic History   Marital status: Divorced    Spouse name: Not on file   Number of children: Not on file   Years of education: Not on file   Highest education level: Not on file  Occupational History   Not on file   Tobacco Use   Smoking status: Never   Smokeless tobacco: Never  Substance and Sexual Activity   Alcohol use: No   Drug use: No   Sexual activity: Not on file  Other Topics Concern   Not on file  Social History Narrative   Not on file   Social Determinants of Health   Financial Resource Strain: Not on file  Food Insecurity: Not on file  Transportation Needs: Not on file  Physical Activity: Not on file  Stress: Not on file  Social Connections: Not on file     FAMILY HISTORY:  We obtained a detailed, 4-generation family history.  Significant diagnoses are listed below: Family History  Problem Relation Age of Onset   Ovarian cancer Mother 22   Prostate cancer Father 50   Cancer Maternal Aunt        "GYN cancer"   Prostate cancer Maternal Uncle    Stroke Maternal Grandmother    Kidney Stones Maternal Grandfather    Kidney cancer Paternal Grandmother     The patient has a son and daughter who are cancer free.  She has a sister who is cancer free.  Her mother died at 57 from ovarian cancer and her father was just diagnosed with metastatic prostate caner.  The patient's father is 44 with metastatic prostate cancer.  He has a sister who  is deceased from non cancer related issues.  His mother died of kidney cancer and his father died of heart disease.  The patient's mother had a brother and two sisters.  The brother died of prostate cancer and one sister had a GYN cancer that is NOS.  This sister had a son who died of an abdominal cancer.  The maternal grandparents are deceased from non-cancer related issues.  Ms. Marandola is unaware of previous family history of genetic testing for hereditary cancer risks. Patient's maternal ancestors are of [place] and [place] descent, and paternal ancestors are of [place] and [place] descent. There is no reported Ashkenazi Jewish ancestry. There is no known consanguinity.  GENETIC COUNSELING ASSESSMENT: Ms. Blevins is a 65 y.o. female with a  family history of cancer which is somewhat suggestive of a hereditary cancer syndrome and predisposition to cancer given the maternal history of ovarian, GYN, prostate and abdominal cancers. We, therefore, discussed and recommended the following at today's visit.   DISCUSSION: We discussed that up to 20% of ovarian cancer is hereditary, with most cases associated with BRCA mutations.  There are other genes that can be associated with hereditary ovarian cancer syndromes.  These include BRIP1, RAD51C, RAD51D and Lynch syndrome.  We discussed that testing is beneficial for several reasons including knowing how to follow individuals a and understand if other family members could be at risk for cancer and allow them to undergo genetic testing.   We reviewed the characteristics, features and inheritance patterns of hereditary cancer syndromes. We also discussed genetic testing, including the appropriate family members to test, the process of testing, insurance coverage and turn-around-time for results. We discussed the implications of a negative, positive, carrier and/or variant of uncertain significant result. We recommended Ms. Gebert pursue genetic testing for the CancerNext-Expanded+RNAinsight gene panel. The CancerNext-Expanded gene panel offered by [facility] and includes sequencing and rearrangement analysis for the following 77 genes: AIP, ALK, APC*, ATM*, AXIN2, BAP1, BARD1, BLM, BMPR1A, BRCA1*, BRCA2*, BRIP1*, CDC73, CDH1*, CDK4, CDKN1B, CDKN2A, CHEK2*, CTNNA1, DICER1, FANCC, FH, FLCN, GALNT12, KIF1B, LZTR1, MAX, MEN1, MET, MLH1*, MSH2*, MSH3, MSH6*, MUTYH*, NBN, NF1*, NF2, NTHL1, PALB2*, PHOX2B, PMS2*, POT1, PRKAR1A, PTCH1, PTEN*, RAD51C*, RAD51D*, RB1, RECQL, RET, SDHA, SDHAF2, SDHB, SDHC, SDHD, SMAD4, SMARCA4, SMARCB1, SMARCE1, STK11, SUFU, TMEM127, TP53*, TSC1, TSC2, VHL and XRCC2 (sequencing and deletion/duplication); EGFR, EGLN1, HOXB13, KIT, MITF, PDGFRA, POLD1, and POLE (sequencing only); EPCAM  and GREM1 (deletion/duplication only). DNA and RNA analyses performed for * genes.   Based on Ms. Luthi's family history of cancer, she meets medical criteria for genetic testing. Despite that she meets criteria, she may still have an out of pocket cost. We discussed that if her out of pocket cost for testing is over $100, the laboratory will call and confirm whether she wants to proceed with testing.  If the out of pocket cost of testing is less than $100 she will be billed by the genetic testing laboratory.   PLAN: After considering the risks, benefits, and limitations, Ms. Bossman provided informed consent to pursue genetic testing and the blood sample was sent to [facility] for analysis of the CancerNext-Expanded+RNAinsight. Results should be available within approximately 2-3 weeks' time, at which point they will be disclosed by telephone to Ms. Mullaly, as will any additional recommendations warranted by these results. Ms. Eiben will receive a summary of her genetic counseling visit and a copy of her results once available. This information will also be available in Epic.   Lastly,  we encouraged Ms. Arenson to remain in contact with cancer genetics annually so that we can continuously update the family history and inform her of any changes in cancer genetics and testing that may be of benefit for this family.   Ms. Bradburn questions were answered to her satisfaction today. Our contact information was provided should additional questions or concerns arise. Thank you for the referral and allowing [place] to share in the care of your patient.   Jaysin Gayler P. Florene [place], [place], [facility] Licensed, Insurance risk surveyor [email or web address] phone: [phone]  The patient was seen for a total of 35 minutes in face-to-face genetic counseling.  The patient brought her father to clinic and she was added on after learning of her family history. This patient was discussed with Drs.  Magrinat, Lindi Adie and/or Burr Medico who agrees with the above.    _______________________________________________________________________ For Office Staff:  Number of people involved in session: 2 Was an Intern/ student involved with case: yes Cristie Hem

## 2021-05-13 ENCOUNTER — Ambulatory Visit: Payer: Self-pay | Admitting: Genetic Counselor

## 2021-05-13 ENCOUNTER — Encounter: Payer: Self-pay | Admitting: Genetic Counselor

## 2021-05-13 ENCOUNTER — Telehealth: Payer: Self-pay | Admitting: Genetic Counselor

## 2021-05-13 DIAGNOSIS — Z1379 Encounter for other screening for genetic and chromosomal anomalies: Secondary | ICD-10-CM | POA: Insufficient documentation

## 2021-05-13 NOTE — Progress Notes (Signed)
HPI:  Ms. Fasig was previously seen in the [place] clinic due to a family history of cancer and concerns regarding a hereditary predisposition to cancer. Please refer to our prior cancer genetics clinic note for more information regarding our discussion, assessment and recommendations, at the time. Ms. Branford recent genetic test results were disclosed to her, as were recommendations warranted by these results. These results and recommendations are discussed in more detail below.  CANCER HISTORY:  Oncology History   No history exists.    FAMILY HISTORY:  We obtained a detailed, 4-generation family history.  Significant diagnoses are listed below: Family History  Problem Relation Age of Onset   Ovarian cancer Mother 75   Prostate cancer Father 66   Cancer Maternal Aunt        "GYN cancer"   Prostate cancer Maternal Uncle    Stroke Maternal Grandmother    Kidney Stones Maternal Grandfather    Kidney cancer Paternal Grandmother     The patient has a son and daughter who are cancer free.  She has a sister who is cancer free.  Her mother died at 41 from ovarian cancer and her father was just diagnosed with metastatic prostate caner.   The patient's father is 43 with metastatic prostate cancer.  He has a sister who is deceased from non cancer related issues.  His mother died of kidney cancer and his father died of heart disease.   The patient's mother had a brother and two sisters.  The brother died of prostate cancer and one sister had a GYN cancer that is NOS.  This sister had a son who died of an abdominal cancer.  The maternal grandparents are deceased from non-cancer related issues.   Ms. Cowdrey is unaware of previous family history of genetic testing for hereditary cancer risks. Patient's maternal ancestors are of [place] descent, and paternal ancestors are of [place] and [place] descent. There is no reported Ashkenazi Jewish ancestry. There is no  known consanguinity.  GENETIC TEST RESULTS: Genetic testing reported out on May 12, 2021 through the CancerNext-Expanded+RNAinsight cancer panel found no pathogenic mutations. The CancerNext-Expanded gene panel offered by [facility] and includes sequencing and rearrangement analysis for the following 77 genes: AIP, ALK, APC*, ATM*, AXIN2, BAP1, BARD1, BLM, BMPR1A, BRCA1*, BRCA2*, BRIP1*, CDC73, CDH1*, CDK4, CDKN1B, CDKN2A, CHEK2*, CTNNA1, DICER1, FANCC, FH, FLCN, GALNT12, KIF1B, LZTR1, MAX, MEN1, MET, MLH1*, MSH2*, MSH3, MSH6*, MUTYH*, NBN, NF1*, NF2, NTHL1, PALB2*, PHOX2B, PMS2*, POT1, PRKAR1A, PTCH1, PTEN*, RAD51C*, RAD51D*, RB1, RECQL, RET, SDHA, SDHAF2, SDHB, SDHC, SDHD, SMAD4, SMARCA4, SMARCB1, SMARCE1, STK11, SUFU, TMEM127, TP53*, TSC1, TSC2, VHL and XRCC2 (sequencing and deletion/duplication); EGFR, EGLN1, HOXB13, KIT, MITF, PDGFRA, POLD1, and POLE (sequencing only); EPCAM and GREM1 (deletion/duplication only). DNA and RNA analyses performed for * genes. The test report has been scanned into EPIC and is located under the Molecular Pathology section of the Results Review tab.  A portion of the result report is included below for reference.     We discussed with Ms. Vidovich that because current genetic testing is not perfect, it is possible there may be a gene mutation in one of these genes that current testing cannot detect, but that chance is small.  We also discussed, that there could be another gene that has not yet been discovered, or that we have not yet tested, that is responsible for the cancer diagnoses in the family. It is also possible there is a hereditary cause for the cancer  in the family that Ms. Attig did not inherit and therefore was not identified in her testing.  Therefore, it is important to remain in touch with cancer genetics in the future so that we can continue to offer Ms. Alban the most up to date genetic testing.   ADDITIONAL GENETIC TESTING: We discussed with Ms.  Solana that her genetic testing was fairly extensive.  If there are genes identified to increase cancer risk that can be analyzed in the future, we would be happy to discuss and coordinate this testing at that time.    CANCER SCREENING RECOMMENDATIONS: Ms. Teater test result is considered negative (normal).  This means that we have not identified a hereditary cause for her family history of cancer at this time. Most cancers happen by chance and this negative test suggests that her cancer may fall into this category.    While reassuring, this does not definitively rule out a hereditary predisposition to cancer. It is still possible that there could be genetic mutations that are undetectable by current technology. There could be genetic mutations in genes that have not been tested or identified to increase cancer risk.  Therefore, it is recommended she continue to follow the cancer management and screening guidelines provided by her and primary healthcare provider.   An individual's cancer risk and medical management are not determined by genetic test results alone. Overall cancer risk assessment incorporates additional factors, including personal medical history, family history, and any available genetic information that may result in a personalized plan for cancer prevention and surveillance  RECOMMENDATIONS FOR FAMILY MEMBERS:  Individuals in this family might be at some increased risk of developing cancer, over the general population risk, simply due to the family history of cancer.  We recommended women in this family have a yearly mammogram beginning at age 21, or 15 years younger than the earliest onset of cancer, an annual clinical breast exam, and perform monthly breast self-exams. Women in this family should also have a gynecological exam as recommended by their primary provider. All family members should be referred for colonoscopy starting at age 63.  It is also possible there is a  hereditary cause for the cancer in Ms. Edmonston's family that she did not inherit and therefore was not identified in her.  Based on Ms. Boback's family history, we recommended her sister have genetic counseling and testing. Ms. Sheperd will let us know if we can be of any assistance in coordinating genetic counseling and/or testing for this family member.   FOLLOW-UP: Lastly, we discussed with Ms. Allport that cancer genetics is a rapidly advancing field and it is possible that new genetic tests will be appropriate for her and/or her family members in the future. We encouraged her to remain in contact with cancer genetics on an annual basis so we can update her personal and family histories and let her know of advances in cancer genetics that may benefit this family.   Our contact number was provided. Ms. Duba questions were answered to her satisfaction, and she knows she is welcome to call us at anytime with additional questions or concerns.   Roma Kayser, [place], [facility] Licensed, Certified Genetic Counselor [email or web address]

## 2021-05-13 NOTE — Telephone Encounter (Signed)
Revealed negative genetic testing.  Discussed that we do not know why she has cancer in the family. It could be due to a different gene that we are not testing, or maybe our current technology may not be able to pick something up.  It will be important for her to keep in contact with genetics to keep up with whether additional testing may be needed.   

## 2021-07-21 DIAGNOSIS — L237 Allergic contact dermatitis due to plants, except food: Secondary | ICD-10-CM | POA: Diagnosis not present

## 2021-08-04 DIAGNOSIS — R002 Palpitations: Secondary | ICD-10-CM | POA: Diagnosis not present

## 2021-08-04 DIAGNOSIS — Z0000 Encounter for general adult medical examination without abnormal findings: Secondary | ICD-10-CM | POA: Diagnosis not present

## 2021-08-04 DIAGNOSIS — Z23 Encounter for immunization: Secondary | ICD-10-CM | POA: Diagnosis not present

## 2021-08-04 DIAGNOSIS — Z5320 Procedure and treatment not carried out because of patient's decision for unspecified reasons: Secondary | ICD-10-CM | POA: Diagnosis not present

## 2021-08-04 DIAGNOSIS — R030 Elevated blood-pressure reading, without diagnosis of hypertension: Secondary | ICD-10-CM | POA: Diagnosis not present

## 2021-08-04 DIAGNOSIS — L03119 Cellulitis of unspecified part of limb: Secondary | ICD-10-CM | POA: Diagnosis not present

## 2021-08-04 DIAGNOSIS — J4520 Mild intermittent asthma, uncomplicated: Secondary | ICD-10-CM | POA: Diagnosis not present

## 2021-08-04 DIAGNOSIS — Z780 Asymptomatic menopausal state: Secondary | ICD-10-CM | POA: Diagnosis not present

## 2021-08-07 ENCOUNTER — Other Ambulatory Visit: Payer: Self-pay | Admitting: Family Medicine

## 2021-08-07 DIAGNOSIS — E2839 Other primary ovarian failure: Secondary | ICD-10-CM

## 2021-09-03 ENCOUNTER — Ambulatory Visit: Payer: BC Managed Care – PPO | Admitting: Family Medicine

## 2021-12-05 DIAGNOSIS — E039 Hypothyroidism, unspecified: Secondary | ICD-10-CM | POA: Diagnosis not present

## 2021-12-05 DIAGNOSIS — B029 Zoster without complications: Secondary | ICD-10-CM | POA: Diagnosis not present

## 2021-12-12 DIAGNOSIS — J029 Acute pharyngitis, unspecified: Secondary | ICD-10-CM | POA: Diagnosis not present

## 2021-12-12 DIAGNOSIS — Z03818 Encounter for observation for suspected exposure to other biological agents ruled out: Secondary | ICD-10-CM | POA: Diagnosis not present

## 2021-12-12 DIAGNOSIS — E038 Other specified hypothyroidism: Secondary | ICD-10-CM | POA: Diagnosis not present

## 2021-12-12 DIAGNOSIS — J020 Streptococcal pharyngitis: Secondary | ICD-10-CM | POA: Diagnosis not present

## 2021-12-12 DIAGNOSIS — R5383 Other fatigue: Secondary | ICD-10-CM | POA: Diagnosis not present

## 2022-01-22 ENCOUNTER — Ambulatory Visit
Admission: RE | Admit: 2022-01-22 | Discharge: 2022-01-22 | Disposition: A | Discharge status: Home / Self Care | Payer: Medicare Other | Source: Ambulatory Visit | Attending: Family Medicine | Admitting: Family Medicine

## 2022-01-22 DIAGNOSIS — E2839 Other primary ovarian failure: Secondary | ICD-10-CM

## 2022-01-22 DIAGNOSIS — M85852 Other specified disorders of bone density and structure, left thigh: Secondary | ICD-10-CM | POA: Diagnosis not present

## 2022-01-22 DIAGNOSIS — Z780 Asymptomatic menopausal state: Secondary | ICD-10-CM | POA: Diagnosis not present

## 2022-03-12 DIAGNOSIS — Z79899 Other long term (current) drug therapy: Secondary | ICD-10-CM | POA: Diagnosis not present

## 2022-03-12 DIAGNOSIS — R1011 Right upper quadrant pain: Secondary | ICD-10-CM | POA: Diagnosis not present

## 2022-03-12 DIAGNOSIS — E039 Hypothyroidism, unspecified: Secondary | ICD-10-CM | POA: Diagnosis not present

## 2022-03-12 DIAGNOSIS — I10 Essential (primary) hypertension: Secondary | ICD-10-CM | POA: Diagnosis not present

## 2022-03-12 DIAGNOSIS — K219 Gastro-esophageal reflux disease without esophagitis: Secondary | ICD-10-CM | POA: Diagnosis not present

## 2022-08-26 DIAGNOSIS — E039 Hypothyroidism, unspecified: Secondary | ICD-10-CM | POA: Diagnosis not present

## 2022-08-26 DIAGNOSIS — Z79899 Other long term (current) drug therapy: Secondary | ICD-10-CM | POA: Diagnosis not present

## 2022-08-26 DIAGNOSIS — E782 Mixed hyperlipidemia: Secondary | ICD-10-CM | POA: Diagnosis not present

## 2022-08-26 DIAGNOSIS — I10 Essential (primary) hypertension: Secondary | ICD-10-CM | POA: Diagnosis not present

## 2022-08-26 DIAGNOSIS — J302 Other seasonal allergic rhinitis: Secondary | ICD-10-CM | POA: Diagnosis not present

## 2022-08-26 DIAGNOSIS — K580 Irritable bowel syndrome with diarrhea: Secondary | ICD-10-CM | POA: Diagnosis not present

## 2022-08-26 DIAGNOSIS — J4520 Mild intermittent asthma, uncomplicated: Secondary | ICD-10-CM | POA: Diagnosis not present

## 2022-09-01 DIAGNOSIS — E039 Hypothyroidism, unspecified: Secondary | ICD-10-CM | POA: Diagnosis not present

## 2022-09-01 DIAGNOSIS — I10 Essential (primary) hypertension: Secondary | ICD-10-CM | POA: Diagnosis not present

## 2022-09-02 ENCOUNTER — Encounter: Payer: Self-pay | Admitting: Internal Medicine

## 2022-09-17 DIAGNOSIS — E039 Hypothyroidism, unspecified: Secondary | ICD-10-CM | POA: Diagnosis not present

## 2022-09-17 DIAGNOSIS — J4520 Mild intermittent asthma, uncomplicated: Secondary | ICD-10-CM | POA: Diagnosis not present

## 2022-09-17 DIAGNOSIS — Z79899 Other long term (current) drug therapy: Secondary | ICD-10-CM | POA: Diagnosis not present

## 2022-09-17 DIAGNOSIS — R1011 Right upper quadrant pain: Secondary | ICD-10-CM | POA: Diagnosis not present

## 2022-09-17 DIAGNOSIS — R0982 Postnasal drip: Secondary | ICD-10-CM | POA: Diagnosis not present

## 2022-09-17 DIAGNOSIS — M25572 Pain in left ankle and joints of left foot: Secondary | ICD-10-CM | POA: Diagnosis not present

## 2022-09-17 DIAGNOSIS — R000 Tachycardia, unspecified: Secondary | ICD-10-CM | POA: Diagnosis not present

## 2022-09-21 DIAGNOSIS — H16223 Keratoconjunctivitis sicca, not specified as Sjogren's, bilateral: Secondary | ICD-10-CM | POA: Diagnosis not present

## 2022-09-21 DIAGNOSIS — Z9889 Other specified postprocedural states: Secondary | ICD-10-CM | POA: Diagnosis not present

## 2022-09-21 DIAGNOSIS — H524 Presbyopia: Secondary | ICD-10-CM | POA: Diagnosis not present

## 2022-09-21 DIAGNOSIS — H04123 Dry eye syndrome of bilateral lacrimal glands: Secondary | ICD-10-CM | POA: Diagnosis not present

## 2022-09-22 ENCOUNTER — Other Ambulatory Visit: Payer: Self-pay | Admitting: Family Medicine

## 2022-09-22 ENCOUNTER — Ambulatory Visit
Admission: RE | Admit: 2022-09-22 | Discharge: 2022-09-22 | Disposition: A | Discharge status: Home / Self Care | Payer: Medicare Other | Source: Ambulatory Visit | Attending: Family Medicine | Admitting: Family Medicine

## 2022-09-22 DIAGNOSIS — M25572 Pain in left ankle and joints of left foot: Secondary | ICD-10-CM

## 2022-09-30 DIAGNOSIS — R058 Other specified cough: Secondary | ICD-10-CM | POA: Diagnosis not present

## 2022-09-30 DIAGNOSIS — J0100 Acute maxillary sinusitis, unspecified: Secondary | ICD-10-CM | POA: Diagnosis not present

## 2022-10-02 ENCOUNTER — Other Ambulatory Visit: Payer: Self-pay | Admitting: Family Medicine

## 2022-10-02 DIAGNOSIS — R1011 Right upper quadrant pain: Secondary | ICD-10-CM

## 2022-10-21 ENCOUNTER — Ambulatory Visit
Admission: RE | Admit: 2022-10-21 | Discharge: 2022-10-21 | Disposition: A | Discharge status: Home / Self Care | Payer: Medicare Other | Source: Ambulatory Visit | Attending: Family Medicine | Admitting: Family Medicine

## 2022-10-21 DIAGNOSIS — R1011 Right upper quadrant pain: Secondary | ICD-10-CM

## 2022-11-04 DIAGNOSIS — Z0000 Encounter for general adult medical examination without abnormal findings: Secondary | ICD-10-CM | POA: Diagnosis not present

## 2022-11-04 DIAGNOSIS — J4520 Mild intermittent asthma, uncomplicated: Secondary | ICD-10-CM | POA: Diagnosis not present

## 2022-12-09 DIAGNOSIS — D485 Neoplasm of uncertain behavior of skin: Secondary | ICD-10-CM | POA: Diagnosis not present

## 2022-12-09 DIAGNOSIS — D2262 Melanocytic nevi of left upper limb, including shoulder: Secondary | ICD-10-CM | POA: Diagnosis not present

## 2022-12-09 DIAGNOSIS — L578 Other skin changes due to chronic exposure to nonionizing radiation: Secondary | ICD-10-CM | POA: Diagnosis not present

## 2022-12-09 DIAGNOSIS — D2261 Melanocytic nevi of right upper limb, including shoulder: Secondary | ICD-10-CM | POA: Diagnosis not present

## 2022-12-09 DIAGNOSIS — L821 Other seborrheic keratosis: Secondary | ICD-10-CM | POA: Diagnosis not present

## 2022-12-09 DIAGNOSIS — D2272 Melanocytic nevi of left lower limb, including hip: Secondary | ICD-10-CM | POA: Diagnosis not present

## 2022-12-09 DIAGNOSIS — D2271 Melanocytic nevi of right lower limb, including hip: Secondary | ICD-10-CM | POA: Diagnosis not present

## 2022-12-09 DIAGNOSIS — L820 Inflamed seborrheic keratosis: Secondary | ICD-10-CM | POA: Diagnosis not present

## 2022-12-09 DIAGNOSIS — R208 Other disturbances of skin sensation: Secondary | ICD-10-CM | POA: Diagnosis not present

## 2022-12-09 DIAGNOSIS — D225 Melanocytic nevi of trunk: Secondary | ICD-10-CM | POA: Diagnosis not present

## 2022-12-23 DIAGNOSIS — J0100 Acute maxillary sinusitis, unspecified: Secondary | ICD-10-CM | POA: Diagnosis not present

## 2023-06-11 DIAGNOSIS — I10 Essential (primary) hypertension: Secondary | ICD-10-CM | POA: Diagnosis not present

## 2023-06-11 DIAGNOSIS — R0989 Other specified symptoms and signs involving the circulatory and respiratory systems: Secondary | ICD-10-CM | POA: Diagnosis not present

## 2023-06-11 DIAGNOSIS — J302 Other seasonal allergic rhinitis: Secondary | ICD-10-CM | POA: Diagnosis not present

## 2023-06-11 DIAGNOSIS — E039 Hypothyroidism, unspecified: Secondary | ICD-10-CM | POA: Diagnosis not present

## 2023-06-11 DIAGNOSIS — E782 Mixed hyperlipidemia: Secondary | ICD-10-CM | POA: Diagnosis not present

## 2023-06-11 DIAGNOSIS — Z03818 Encounter for observation for suspected exposure to other biological agents ruled out: Secondary | ICD-10-CM | POA: Diagnosis not present

## 2023-07-21 DIAGNOSIS — I10 Essential (primary) hypertension: Secondary | ICD-10-CM | POA: Diagnosis not present

## 2023-07-21 DIAGNOSIS — J0100 Acute maxillary sinusitis, unspecified: Secondary | ICD-10-CM | POA: Diagnosis not present

## 2023-07-21 DIAGNOSIS — L089 Local infection of the skin and subcutaneous tissue, unspecified: Secondary | ICD-10-CM | POA: Diagnosis not present

## 2023-10-08 DIAGNOSIS — H04123 Dry eye syndrome of bilateral lacrimal glands: Secondary | ICD-10-CM | POA: Diagnosis not present

## 2023-10-08 DIAGNOSIS — H524 Presbyopia: Secondary | ICD-10-CM | POA: Diagnosis not present

## 2023-10-08 DIAGNOSIS — H16223 Keratoconjunctivitis sicca, not specified as Sjogren's, bilateral: Secondary | ICD-10-CM | POA: Diagnosis not present

## 2023-10-08 DIAGNOSIS — H52213 Irregular astigmatism, bilateral: Secondary | ICD-10-CM | POA: Diagnosis not present

## 2023-10-08 DIAGNOSIS — Z9889 Other specified postprocedural states: Secondary | ICD-10-CM | POA: Diagnosis not present

## 2023-12-15 DIAGNOSIS — L2989 Other pruritus: Secondary | ICD-10-CM | POA: Diagnosis not present

## 2023-12-15 DIAGNOSIS — D225 Melanocytic nevi of trunk: Secondary | ICD-10-CM | POA: Diagnosis not present

## 2023-12-15 DIAGNOSIS — D2261 Melanocytic nevi of right upper limb, including shoulder: Secondary | ICD-10-CM | POA: Diagnosis not present

## 2023-12-15 DIAGNOSIS — D2271 Melanocytic nevi of right lower limb, including hip: Secondary | ICD-10-CM | POA: Diagnosis not present

## 2023-12-15 DIAGNOSIS — L578 Other skin changes due to chronic exposure to nonionizing radiation: Secondary | ICD-10-CM | POA: Diagnosis not present

## 2023-12-15 DIAGNOSIS — L821 Other seborrheic keratosis: Secondary | ICD-10-CM | POA: Diagnosis not present

## 2023-12-15 DIAGNOSIS — D2272 Melanocytic nevi of left lower limb, including hip: Secondary | ICD-10-CM | POA: Diagnosis not present

## 2023-12-15 DIAGNOSIS — D2262 Melanocytic nevi of left upper limb, including shoulder: Secondary | ICD-10-CM | POA: Diagnosis not present

## 2024-02-03 DIAGNOSIS — E782 Mixed hyperlipidemia: Secondary | ICD-10-CM | POA: Diagnosis not present

## 2024-02-03 DIAGNOSIS — R000 Tachycardia, unspecified: Secondary | ICD-10-CM | POA: Diagnosis not present

## 2024-02-03 DIAGNOSIS — J4520 Mild intermittent asthma, uncomplicated: Secondary | ICD-10-CM | POA: Diagnosis not present

## 2024-02-03 DIAGNOSIS — E039 Hypothyroidism, unspecified: Secondary | ICD-10-CM | POA: Diagnosis not present

## 2024-02-03 DIAGNOSIS — I10 Essential (primary) hypertension: Secondary | ICD-10-CM | POA: Diagnosis not present

## 2024-02-03 DIAGNOSIS — M8580 Other specified disorders of bone density and structure, unspecified site: Secondary | ICD-10-CM | POA: Diagnosis not present

## 2024-02-03 DIAGNOSIS — Z789 Other specified health status: Secondary | ICD-10-CM | POA: Diagnosis not present

## 2024-02-03 DIAGNOSIS — J302 Other seasonal allergic rhinitis: Secondary | ICD-10-CM | POA: Diagnosis not present

## 2024-02-03 DIAGNOSIS — Z0000 Encounter for general adult medical examination without abnormal findings: Secondary | ICD-10-CM | POA: Diagnosis not present

## 2024-02-07 ENCOUNTER — Other Ambulatory Visit: Payer: Self-pay | Admitting: Family Medicine

## 2024-02-07 DIAGNOSIS — M8580 Other specified disorders of bone density and structure, unspecified site: Secondary | ICD-10-CM

## 2024-05-15 ENCOUNTER — Other Ambulatory Visit: Payer: Self-pay | Admitting: Medical Genetics

## 2024-05-22 ENCOUNTER — Ambulatory Visit
Admission: RE | Admit: 2024-05-22 | Discharge: 2024-05-22 | Disposition: A | Discharge status: Home / Self Care | Source: Ambulatory Visit | Attending: Family Medicine | Admitting: Family Medicine

## 2024-05-22 DIAGNOSIS — M8580 Other specified disorders of bone density and structure, unspecified site: Secondary | ICD-10-CM | POA: Insufficient documentation

## 2024-05-22 DIAGNOSIS — M85852 Other specified disorders of bone density and structure, left thigh: Secondary | ICD-10-CM | POA: Diagnosis not present

## 2024-05-22 DIAGNOSIS — Z780 Asymptomatic menopausal state: Secondary | ICD-10-CM | POA: Diagnosis not present

## 2024-05-22 DIAGNOSIS — M8589 Other specified disorders of bone density and structure, multiple sites: Secondary | ICD-10-CM | POA: Diagnosis not present

## 2024-05-22 DIAGNOSIS — Z13820 Encounter for screening for osteoporosis: Secondary | ICD-10-CM | POA: Diagnosis not present

## 2024-06-15 ENCOUNTER — Other Ambulatory Visit
Admission: RE | Admit: 2024-06-15 | Discharge: 2024-06-15 | Disposition: A | Discharge status: Home / Self Care | Payer: Self-pay | Source: Ambulatory Visit | Attending: Medical Genetics | Admitting: Medical Genetics

## 2024-06-27 LAB — GENECONNECT MOLECULAR SCREEN: Genetic Analysis Overall Interpretation: NEGATIVE

## 2024-10-05 ENCOUNTER — Encounter: Payer: Self-pay | Admitting: *Deleted

## 2024-10-05 NOTE — Progress Notes (Signed)
 Krystal Padilla                                          MRN: [id number]   10/05/2024   The [facility] Quality Team Specialist reviewed this patient medical record for the purposes of chart review for care gap closure. The following were reviewed: chart review for care gap closure-breast cancer screening and colorectal cancer screening.    [facility] Quality Team

## 2024-10-23 ENCOUNTER — Other Ambulatory Visit
